# Patient Record
Sex: Female | Born: 1980 | Race: White | Hispanic: No | Marital: Married | Smoking: Former smoker
Health system: Southern US, Community
[De-identification: ages and names within clinical notes are randomized; demographics above are authoritative.]

## PROBLEM LIST (undated history)

## (undated) ENCOUNTER — Inpatient Hospital Stay (HOSPITAL_COMMUNITY): Payer: Self-pay

## (undated) DIAGNOSIS — E559 Vitamin D deficiency, unspecified: Secondary | ICD-10-CM

## (undated) DIAGNOSIS — F329 Major depressive disorder, single episode, unspecified: Secondary | ICD-10-CM

## (undated) DIAGNOSIS — Z8619 Personal history of other infectious and parasitic diseases: Secondary | ICD-10-CM

## (undated) DIAGNOSIS — F32A Depression, unspecified: Secondary | ICD-10-CM

## (undated) DIAGNOSIS — O139 Gestational [pregnancy-induced] hypertension without significant proteinuria, unspecified trimester: Secondary | ICD-10-CM

## (undated) DIAGNOSIS — O210 Mild hyperemesis gravidarum: Secondary | ICD-10-CM

## (undated) DIAGNOSIS — I10 Essential (primary) hypertension: Secondary | ICD-10-CM

## (undated) DIAGNOSIS — IMO0002 Reserved for concepts with insufficient information to code with codable children: Secondary | ICD-10-CM

## (undated) DIAGNOSIS — F419 Anxiety disorder, unspecified: Secondary | ICD-10-CM

## (undated) HISTORY — DX: Essential (primary) hypertension: I10

## (undated) HISTORY — DX: Vitamin D deficiency, unspecified: E55.9

## (undated) HISTORY — PX: TONSILLECTOMY: SUR1361

## (undated) HISTORY — DX: Personal history of other infectious and parasitic diseases: Z86.19

## (undated) HISTORY — DX: Mild hyperemesis gravidarum: O21.0

## (undated) HISTORY — DX: Reserved for concepts with insufficient information to code with codable children: IMO0002

---
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
## 2001-12-26 ENCOUNTER — Other Ambulatory Visit: Admission: RE | Admit: 2001-12-26 | Discharge: 2001-12-26 | Payer: Self-pay | Admitting: Obstetrics and Gynecology

## 2011-06-02 DIAGNOSIS — IMO0002 Reserved for concepts with insufficient information to code with codable children: Secondary | ICD-10-CM

## 2011-06-02 DIAGNOSIS — R87619 Unspecified abnormal cytological findings in specimens from cervix uteri: Secondary | ICD-10-CM

## 2011-06-02 HISTORY — DX: Unspecified abnormal cytological findings in specimens from cervix uteri: R87.619

## 2011-06-02 HISTORY — DX: Reserved for concepts with insufficient information to code with codable children: IMO0002

## 2011-08-02 DIAGNOSIS — O210 Mild hyperemesis gravidarum: Secondary | ICD-10-CM

## 2011-08-02 HISTORY — DX: Mild hyperemesis gravidarum: O21.0

## 2011-09-05 LAB — OB RESULTS CONSOLE GC/CHLAMYDIA: Gonorrhea: NEGATIVE

## 2011-09-05 LAB — OB RESULTS CONSOLE ABO/RH

## 2011-09-05 LAB — OB RESULTS CONSOLE ANTIBODY SCREEN: Antibody Screen: NEGATIVE

## 2011-09-05 LAB — OB RESULTS CONSOLE RUBELLA ANTIBODY, IGM: Rubella: IMMUNE

## 2011-10-21 ENCOUNTER — Encounter (INDEPENDENT_AMBULATORY_CARE_PROVIDER_SITE_OTHER): Payer: BC Managed Care – PPO

## 2011-10-21 DIAGNOSIS — Z331 Pregnant state, incidental: Secondary | ICD-10-CM

## 2011-11-04 DIAGNOSIS — IMO0002 Reserved for concepts with insufficient information to code with codable children: Secondary | ICD-10-CM

## 2011-11-17 ENCOUNTER — Other Ambulatory Visit: Payer: Self-pay | Admitting: Obstetrics and Gynecology

## 2011-11-17 ENCOUNTER — Ambulatory Visit (INDEPENDENT_AMBULATORY_CARE_PROVIDER_SITE_OTHER): Payer: BC Managed Care – PPO

## 2011-11-17 ENCOUNTER — Ambulatory Visit (INDEPENDENT_AMBULATORY_CARE_PROVIDER_SITE_OTHER): Payer: BC Managed Care – PPO | Admitting: Obstetrics and Gynecology

## 2011-11-17 ENCOUNTER — Encounter: Payer: Self-pay | Admitting: Obstetrics and Gynecology

## 2011-11-17 ENCOUNTER — Other Ambulatory Visit: Payer: BC Managed Care – PPO

## 2011-11-17 VITALS — BP 110/70 | Ht 62.0 in | Wt 163.0 lb

## 2011-11-17 DIAGNOSIS — Z3689 Encounter for other specified antenatal screening: Secondary | ICD-10-CM

## 2011-11-17 DIAGNOSIS — Z3400 Encounter for supervision of normal first pregnancy, unspecified trimester: Secondary | ICD-10-CM

## 2011-11-17 LAB — US OB COMP + 14 WK

## 2011-11-17 NOTE — Progress Notes (Signed)
Ultrasound shows:  SIUP  S=D     [place] EDD: 04/10/12           AFI: nl           Cervical length: 4.10 cm           Placenta localization: anterior           Fetal presentation: breech                    Anatomy survey is normal           Gender : DNWTK  Only 1 lb net wt gain.  Supplements suggested Needs rpt pap at NV

## 2011-11-17 NOTE — Progress Notes (Signed)
Pt states she has no concerns ?

## 2011-12-12 ENCOUNTER — Inpatient Hospital Stay (HOSPITAL_COMMUNITY): Admission: AD | Admit: 2011-12-12 | Payer: Self-pay | Source: Ambulatory Visit | Admitting: Obstetrics and Gynecology

## 2011-12-12 ENCOUNTER — Inpatient Hospital Stay (HOSPITAL_COMMUNITY)
Admission: AD | Admit: 2011-12-12 | Payer: BC Managed Care – PPO | Source: Ambulatory Visit | Admitting: Obstetrics and Gynecology

## 2011-12-15 ENCOUNTER — Encounter: Payer: BC Managed Care – PPO | Admitting: Obstetrics and Gynecology

## 2011-12-21 ENCOUNTER — Ambulatory Visit (INDEPENDENT_AMBULATORY_CARE_PROVIDER_SITE_OTHER): Payer: BC Managed Care – PPO | Admitting: Obstetrics and Gynecology

## 2011-12-21 ENCOUNTER — Encounter: Payer: Self-pay | Admitting: Obstetrics and Gynecology

## 2011-12-21 VITALS — BP 110/70 | Wt 168.0 lb

## 2011-12-21 DIAGNOSIS — R87610 Atypical squamous cells of undetermined significance on cytologic smear of cervix (ASC-US): Secondary | ICD-10-CM

## 2011-12-21 DIAGNOSIS — R6889 Other general symptoms and signs: Secondary | ICD-10-CM

## 2011-12-21 DIAGNOSIS — Z331 Pregnant state, incidental: Secondary | ICD-10-CM

## 2011-12-21 DIAGNOSIS — IMO0002 Reserved for concepts with insufficient information to code with codable children: Secondary | ICD-10-CM

## 2011-12-21 NOTE — Progress Notes (Signed)
Patient ID: Susan Zamora, female   DOB: [birth year], 31 y.o.   MRN: [id number] Pap today. Reviewed s/s preterm labor, srom, vag bleeding, kick counts to report, enc 8 water daily and frequent voids, f/o 4 gtt [facility]

## 2011-12-21 NOTE — Progress Notes (Signed)
No concerns per pt Pap due today

## 2011-12-27 LAB — PAP IG W/ RFLX HPV ASCU

## 2012-01-02 ENCOUNTER — Telehealth: Payer: Self-pay | Admitting: Obstetrics and Gynecology

## 2012-01-02 NOTE — Telephone Encounter (Signed)
Triage/cht received 

## 2012-01-03 ENCOUNTER — Encounter: Payer: Self-pay | Admitting: Obstetrics and Gynecology

## 2012-01-05 NOTE — Telephone Encounter (Signed)
TC TO PT REGARDING MSG, LM ON VM TO CALL BACK. 

## 2012-01-13 ENCOUNTER — Telehealth: Payer: Self-pay | Admitting: Obstetrics and Gynecology

## 2012-01-13 NOTE — Telephone Encounter (Signed)
Triage/epic 

## 2012-01-17 ENCOUNTER — Encounter: Payer: Self-pay | Admitting: Obstetrics and Gynecology

## 2012-01-17 NOTE — Telephone Encounter (Signed)
TC TO PT REGARDING MSG, PER SL CAN HAVE LETTER TO ATTEND PRENATAL [place] CLASSES.  LETTER WILL BE AVAILABLE FOR PT TO PICK @ APPT TOMORROW.

## 2012-01-18 ENCOUNTER — Other Ambulatory Visit: Payer: BC Managed Care – PPO

## 2012-01-18 ENCOUNTER — Ambulatory Visit (INDEPENDENT_AMBULATORY_CARE_PROVIDER_SITE_OTHER): Payer: BC Managed Care – PPO

## 2012-01-18 ENCOUNTER — Ambulatory Visit (INDEPENDENT_AMBULATORY_CARE_PROVIDER_SITE_OTHER): Payer: BC Managed Care – PPO | Admitting: Obstetrics and Gynecology

## 2012-01-18 VITALS — BP 110/78 | Wt 168.0 lb

## 2012-01-18 DIAGNOSIS — O2610 Low weight gain in pregnancy, unspecified trimester: Secondary | ICD-10-CM

## 2012-01-18 DIAGNOSIS — O26899 Other specified pregnancy related conditions, unspecified trimester: Secondary | ICD-10-CM

## 2012-01-18 DIAGNOSIS — Z331 Pregnant state, incidental: Secondary | ICD-10-CM

## 2012-01-18 NOTE — Progress Notes (Signed)
1 GTT given today. Pt has no concerns today.

## 2012-01-18 NOTE — Progress Notes (Signed)
Patient ID: Susan Zamora, female   DOB: [birth year], 31 y.o.   MRN: [id number] 1 gtt today, [place] S<D VTX, anterior placenta, Normal AFI growth 75%

## 2012-01-19 LAB — GLUCOSE TOLERANCE, 1 HOUR: Glucose, 1 Hour GTT: 142 mg/dL — ABNORMAL HIGH (ref 70–140)

## 2012-01-19 LAB — US OB FOLLOW UP

## 2012-01-20 ENCOUNTER — Other Ambulatory Visit: Payer: BC Managed Care – PPO

## 2012-01-23 ENCOUNTER — Telehealth: Payer: Self-pay | Admitting: Obstetrics and Gynecology

## 2012-01-23 ENCOUNTER — Other Ambulatory Visit: Payer: Self-pay | Admitting: Obstetrics and Gynecology

## 2012-01-23 DIAGNOSIS — O99810 Abnormal glucose complicating pregnancy: Secondary | ICD-10-CM

## 2012-01-23 NOTE — Telephone Encounter (Signed)
Notified pt of elevated blood glucose on 1 hr GTT.  Pt out of town until 02-05-12.  Mailed diet and appt info. To pt.  Sch appt for 02-10-12@8 :00.

## 2012-02-06 ENCOUNTER — Encounter: Payer: Self-pay | Admitting: Obstetrics and Gynecology

## 2012-02-06 ENCOUNTER — Ambulatory Visit (INDEPENDENT_AMBULATORY_CARE_PROVIDER_SITE_OTHER): Payer: BC Managed Care – PPO | Admitting: Obstetrics and Gynecology

## 2012-02-06 VITALS — BP 110/68 | Wt 173.0 lb

## 2012-02-06 DIAGNOSIS — Z3480 Encounter for supervision of other normal pregnancy, unspecified trimester: Secondary | ICD-10-CM

## 2012-02-06 NOTE — Patient Instructions (Signed)
Fetal Movement Counts Patient Name: __________________________________________________ Patient Due Date: ____________________ Kick counts is highly recommended in high risk pregnancies, but it is a good idea for every pregnant woman to do. Start counting fetal movements at 28 weeks of the pregnancy. Fetal movements increase after eating a full meal or eating or drinking something sweet (the blood sugar is higher). It is also important to drink plenty of fluids (well hydrated) before doing the count. Lie on your left side because it helps with the circulation or you can sit in a comfortable chair with your arms over your belly (abdomen) with no distractions around you. DOING THE COUNT  Try to do the count the same time of day each time you do it.   Mark the day and time, then see how long it takes for you to feel 10 movements (kicks, flutters, swishes, rolls). You should have at least 10 movements within 2 hours. You will most likely feel 10 movements in much less than 2 hours. If you do not, wait an hour and count again. After a couple of days you will see a pattern.   What you are looking for is a change in the pattern or not enough counts in 2 hours. Is it taking longer in time to reach 10 movements?  SEEK MEDICAL CARE IF:  You feel less than 10 counts in 2 hours. Tried twice.   No movement in one hour.   The pattern is changing or taking longer each day to reach 10 counts in 2 hours.   You feel the baby is not moving as it usually does.  Date: ____________ Movements: ____________ Start time: ____________ Finish time: ____________  Date: ____________ Movements: ____________ Start time: ____________ Finish time: ____________ Date: ____________ Movements: ____________ Start time: ____________ Finish time: ____________ Date: ____________ Movements: ____________ Start time: ____________ Finish time: ____________ Date: ____________ Movements: ____________ Start time: ____________ Finish time:  ____________ Date: ____________ Movements: ____________ Start time: ____________ Finish time: ____________ Date: ____________ Movements: ____________ Start time: ____________ Finish time: ____________ Date: ____________ Movements: ____________ Start time: ____________ Finish time: ____________  Date: ____________ Movements: ____________ Start time: ____________ Finish time: ____________ Date: ____________ Movements: ____________ Start time: ____________ Finish time: ____________ Date: ____________ Movements: ____________ Start time: ____________ Finish time: ____________ Date: ____________ Movements: ____________ Start time: ____________ Finish time: ____________ Date: ____________ Movements: ____________ Start time: ____________ Finish time: ____________ Date: ____________ Movements: ____________ Start time: ____________ Finish time: ____________ Date: ____________ Movements: ____________ Start time: ____________ Finish time: ____________  Date: ____________ Movements: ____________ Start time: ____________ Finish time: ____________ Date: ____________ Movements: ____________ Start time: ____________ Finish time: ____________ Date: ____________ Movements: ____________ Start time: ____________ Finish time: ____________ Date: ____________ Movements: ____________ Start time: ____________ Finish time: ____________ Date: ____________ Movements: ____________ Start time: ____________ Finish time: ____________ Date: ____________ Movements: ____________ Start time: ____________ Finish time: ____________ Date: ____________ Movements: ____________ Start time: ____________ Finish time: ____________  Date: ____________ Movements: ____________ Start time: ____________ Finish time: ____________ Date: ____________ Movements: ____________ Start time: ____________ Finish time: ____________ Date: ____________ Movements: ____________ Start time: ____________ Finish time: ____________ Date: ____________ Movements:  ____________ Start time: ____________ Finish time: ____________ Date: ____________ Movements: ____________ Start time: ____________ Finish time: ____________ Date: ____________ Movements: ____________ Start time: ____________ Finish time: ____________ Date: ____________ Movements: ____________ Start time: ____________ Finish time: ____________  Date: ____________ Movements: ____________ Start time: ____________ Finish time: ____________ Date: ____________ Movements: ____________ Start time: ____________ Finish time: ____________ Date: ____________ Movements: ____________ Start time:   ____________ Finish time: ____________ Date: ____________ Movements: ____________ Start time: ____________ Finish time: ____________ Date: ____________ Movements: ____________ Start time: ____________ Finish time: ____________ Date: ____________ Movements: ____________ Start time: ____________ Finish time: ____________ Date: ____________ Movements: ____________ Start time: ____________ Finish time: ____________  Date: ____________ Movements: ____________ Start time: ____________ Finish time: ____________ Date: ____________ Movements: ____________ Start time: ____________ Finish time: ____________ Date: ____________ Movements: ____________ Start time: ____________ Finish time: ____________ Date: ____________ Movements: ____________ Start time: ____________ Finish time: ____________ Date: ____________ Movements: ____________ Start time: ____________ Finish time: ____________ Date: ____________ Movements: ____________ Start time: ____________ Finish time: ____________ Date: ____________ Movements: ____________ Start time: ____________ Finish time: ____________  Date: ____________ Movements: ____________ Start time: ____________ Finish time: ____________ Date: ____________ Movements: ____________ Start time: ____________ Finish time: ____________ Date: ____________ Movements: ____________ Start time: ____________ Finish  time: ____________ Date: ____________ Movements: ____________ Start time: ____________ Finish time: ____________ Date: ____________ Movements: ____________ Start time: ____________ Finish time: ____________ Date: ____________ Movements: ____________ Start time: ____________ Finish time: ____________ Date: ____________ Movements: ____________ Start time: ____________ Finish time: ____________  Date: ____________ Movements: ____________ Start time: ____________ Finish time: ____________ Date: ____________ Movements: ____________ Start time: ____________ Finish time: ____________ Date: ____________ Movements: ____________ Start time: ____________ Finish time: ____________ Date: ____________ Movements: ____________ Start time: ____________ Finish time: ____________ Date: ____________ Movements: ____________ Start time: ____________ Finish time: ____________ Date: ____________ Movements: ____________ Start time: ____________ Finish time: ____________ Document Released: 08/17/2006 Document Revised: 07/07/2011 Document Reviewed: 02/17/2009 [employer] Patient Information 2012 [employer]. 

## 2012-02-06 NOTE — Progress Notes (Signed)
Pt states no concerns today.  One hr glucola 142 FKC reviewed Pt scheduled for 3 hr gtt.  02/10/12 RT 2 weeks

## 2012-02-07 ENCOUNTER — Telehealth: Payer: Self-pay | Admitting: Obstetrics and Gynecology

## 2012-02-10 ENCOUNTER — Other Ambulatory Visit: Payer: Self-pay | Admitting: Obstetrics and Gynecology

## 2012-02-10 ENCOUNTER — Telehealth: Payer: Self-pay | Admitting: Obstetrics and Gynecology

## 2012-02-11 LAB — GLUCOSE TOLERANCE, 3 HOURS
Glucose Tolerance, 1 hour: 145 mg/dL (ref 70–189)
Glucose Tolerance, 2 hour: 130 mg/dL (ref 70–164)
Glucose Tolerance, Fasting: 90 mg/dL (ref 70–104)

## 2012-02-20 ENCOUNTER — Ambulatory Visit (INDEPENDENT_AMBULATORY_CARE_PROVIDER_SITE_OTHER): Payer: BC Managed Care – PPO | Admitting: Obstetrics and Gynecology

## 2012-02-20 ENCOUNTER — Encounter: Payer: Self-pay | Admitting: Obstetrics and Gynecology

## 2012-02-20 VITALS — BP 100/66 | Wt 170.0 lb

## 2012-02-20 DIAGNOSIS — Z3490 Encounter for supervision of normal pregnancy, unspecified, unspecified trimester: Secondary | ICD-10-CM

## 2012-02-20 DIAGNOSIS — Z331 Pregnant state, incidental: Secondary | ICD-10-CM

## 2012-02-20 NOTE — Progress Notes (Signed)
[redacted]w[redacted]d C/o Nausea - mostly acid reflux -  Recommended Zantac 150 mgs po once daily No change in vaginal secretions. No further complaints

## 2012-02-20 NOTE — Progress Notes (Signed)
Pt c/o N/V.

## 2012-03-06 ENCOUNTER — Ambulatory Visit (INDEPENDENT_AMBULATORY_CARE_PROVIDER_SITE_OTHER): Payer: BC Managed Care – PPO

## 2012-03-06 VITALS — BP 108/86 | Wt 171.0 lb

## 2012-03-06 DIAGNOSIS — O2610 Low weight gain in pregnancy, unspecified trimester: Secondary | ICD-10-CM

## 2012-03-06 DIAGNOSIS — O26899 Other specified pregnancy related conditions, unspecified trimester: Secondary | ICD-10-CM

## 2012-03-06 NOTE — Progress Notes (Signed)
No complaints today.

## 2012-03-06 NOTE — Progress Notes (Signed)
[redacted]w[redacted]d. Well.  Will do growth u/s NV for poor wt gain, along w/ GBS.  Still in [place] classes.  GFM.  No PTL s/s.  S.o., Iantha Fallen, at visit.

## 2012-03-20 ENCOUNTER — Ambulatory Visit (INDEPENDENT_AMBULATORY_CARE_PROVIDER_SITE_OTHER): Payer: BC Managed Care – PPO

## 2012-03-20 VITALS — Wt 173.0 lb

## 2012-03-20 DIAGNOSIS — O2610 Low weight gain in pregnancy, unspecified trimester: Secondary | ICD-10-CM

## 2012-03-20 DIAGNOSIS — Z331 Pregnant state, incidental: Secondary | ICD-10-CM

## 2012-03-20 DIAGNOSIS — O26899 Other specified pregnancy related conditions, unspecified trimester: Secondary | ICD-10-CM

## 2012-03-20 LAB — US OB FOLLOW UP

## 2012-03-20 NOTE — Progress Notes (Signed)
[redacted]w[redacted]d GBS today; int os FT; ext os loose 1cm/ very soft; 50%.  Growth u/s today for poor wt gain & EFW=7+6 (87%); AFI=19 (70%); Ant placenta; of NOTE: ECHOGENIC DEBRIS W/IN FETAL GB--COULD REPRESENT STONES, SLUDGE, OR CHOLESTEROL CRYSTALS; will put on IPPT tool. Disc'd BP--pt to condense.  May use tub to labor (doula to bring and set up), but doesn't plan to labor in.   Rev'd FKC and labor s/s.

## 2012-03-20 NOTE — Progress Notes (Signed)
No complaints.  GBS today U/S Comments: Vertex Presentation, Anterior Placenta. Normal Fluid: 70%tile. Growth remaining consistent. EFW = 89%tile  Note: Echogenic debris within the fetal gallbladder is visualized. This could represent: stones, sludge, or cholesterol crystals.  Cx not measured. Normal adnexa.

## 2012-03-30 ENCOUNTER — Encounter: Payer: Self-pay | Admitting: Obstetrics and Gynecology

## 2012-03-30 ENCOUNTER — Ambulatory Visit (INDEPENDENT_AMBULATORY_CARE_PROVIDER_SITE_OTHER): Payer: BC Managed Care – PPO | Admitting: Obstetrics and Gynecology

## 2012-03-30 VITALS — BP 120/82 | Wt 174.0 lb

## 2012-03-30 DIAGNOSIS — Z331 Pregnant state, incidental: Secondary | ICD-10-CM

## 2012-03-30 DIAGNOSIS — Z3490 Encounter for supervision of normal pregnancy, unspecified, unspecified trimester: Secondary | ICD-10-CM

## 2012-03-30 NOTE — Progress Notes (Signed)
No complaints per pt

## 2012-03-30 NOTE — Progress Notes (Signed)
[redacted]w[redacted]d  no complaints No change in vaginal secretions FM+ Does not desire IOL if possible Will attend accupucture therapy this week with view to assisting delivery process. ROB x 1 week.

## 2012-04-05 ENCOUNTER — Ambulatory Visit (INDEPENDENT_AMBULATORY_CARE_PROVIDER_SITE_OTHER): Payer: Self-pay | Admitting: Obstetrics and Gynecology

## 2012-04-05 ENCOUNTER — Encounter: Payer: Self-pay | Admitting: Obstetrics and Gynecology

## 2012-04-05 VITALS — BP 108/82 | Wt 171.0 lb

## 2012-04-05 DIAGNOSIS — Z331 Pregnant state, incidental: Secondary | ICD-10-CM

## 2012-04-05 NOTE — Patient Instructions (Signed)

## 2012-04-05 NOTE — Progress Notes (Signed)
A/P GBS negative Fetal kick counts reviewed Labor reviewed with pt All patients  questions answered

## 2012-04-05 NOTE — Progress Notes (Signed)
Pt states no concerns today and does not want cervix checked. Pt has chosen Dr. Jolaine Click at [facility] for pediatrician.

## 2012-04-11 ENCOUNTER — Ambulatory Visit (INDEPENDENT_AMBULATORY_CARE_PROVIDER_SITE_OTHER): Payer: Self-pay | Admitting: Obstetrics and Gynecology

## 2012-04-11 ENCOUNTER — Encounter: Payer: Self-pay | Admitting: Obstetrics and Gynecology

## 2012-04-11 VITALS — BP 122/72 | Wt 173.0 lb

## 2012-04-11 DIAGNOSIS — Z331 Pregnant state, incidental: Secondary | ICD-10-CM

## 2012-04-11 DIAGNOSIS — Z3490 Encounter for supervision of normal pregnancy, unspecified, unspecified trimester: Secondary | ICD-10-CM

## 2012-04-11 NOTE — Progress Notes (Signed)
[redacted]w[redacted]d pt unable to void Request cervix check.

## 2012-04-11 NOTE — Progress Notes (Signed)
Doing well--desires to avoid induction as long as possible, understands recommendation for delivery prior to 42 weeks. Cervix FT, 50%, vtx, -2, soft. Birth plan had been reviewed with HS, copy today to scan center. NST NV.

## 2012-04-19 ENCOUNTER — Encounter: Payer: Self-pay | Admitting: Obstetrics and Gynecology

## 2012-04-19 ENCOUNTER — Ambulatory Visit (INDEPENDENT_AMBULATORY_CARE_PROVIDER_SITE_OTHER): Payer: BC Managed Care – PPO | Admitting: Obstetrics and Gynecology

## 2012-04-19 ENCOUNTER — Other Ambulatory Visit: Payer: BC Managed Care – PPO

## 2012-04-19 VITALS — BP 130/88 | Wt 175.0 lb

## 2012-04-19 DIAGNOSIS — O480 Post-term pregnancy: Secondary | ICD-10-CM

## 2012-04-19 NOTE — Progress Notes (Signed)
Pt here for ROB after NST for post-dates. C/o increased pressure/ no pain. B/P elevated today 144/90. On second check it is 130/88. No visual changes, H/A's. She does state she has epigastric pain/indigestion, but she has had problems with that the entire pregnancy.

## 2012-04-19 NOTE — Progress Notes (Signed)
[redacted]w[redacted]d NST reactive Pt and husband refuse to be scheduled for IOL. Informed that standard of care is no more than 42 weeks for risks of placental insufficiency. Still decline. Will follow-up in 1 week with BPP and NST Pt informed that we may have her sign AMA

## 2012-04-25 ENCOUNTER — Encounter (HOSPITAL_COMMUNITY): Payer: Self-pay | Admitting: Anesthesiology

## 2012-04-25 ENCOUNTER — Encounter (HOSPITAL_COMMUNITY): Payer: Self-pay

## 2012-04-25 ENCOUNTER — Inpatient Hospital Stay (HOSPITAL_COMMUNITY)
Admission: AD | Admit: 2012-04-25 | Discharge: 2012-04-29 | DRG: 370 | Disposition: A | Discharge status: Home / Self Care | Payer: BC Managed Care – PPO | Source: Ambulatory Visit | Attending: Obstetrics and Gynecology | Admitting: Obstetrics and Gynecology

## 2012-04-25 DIAGNOSIS — O34599 Maternal care for other abnormalities of gravid uterus, unspecified trimester: Secondary | ICD-10-CM | POA: Diagnosis present

## 2012-04-25 DIAGNOSIS — O324XX0 Maternal care for high head at term, not applicable or unspecified: Secondary | ICD-10-CM | POA: Diagnosis present

## 2012-04-25 DIAGNOSIS — O2610 Low weight gain in pregnancy, unspecified trimester: Secondary | ICD-10-CM

## 2012-04-25 DIAGNOSIS — D4959 Neoplasm of unspecified behavior of other genitourinary organ: Secondary | ICD-10-CM | POA: Diagnosis present

## 2012-04-25 DIAGNOSIS — IMO0002 Reserved for concepts with insufficient information to code with codable children: Secondary | ICD-10-CM | POA: Diagnosis not present

## 2012-04-25 DIAGNOSIS — D259 Leiomyoma of uterus, unspecified: Secondary | ICD-10-CM | POA: Diagnosis present

## 2012-04-25 LAB — COMPREHENSIVE METABOLIC PANEL
ALT: 11 U/L (ref 0–35)
AST: 17 U/L (ref 0–37)
Albumin: 2.6 g/dL — ABNORMAL LOW (ref 3.5–5.2)
BUN: 11 mg/dL (ref 6–23)
CO2: 20 mEq/L (ref 19–32)
Calcium: 9.4 mg/dL (ref 8.4–10.5)
Chloride: 94 mEq/L — ABNORMAL LOW (ref 96–112)
Creatinine, Ser: 0.81 mg/dL (ref 0.50–1.10)
GFR calc Af Amer: >90 mL/min (ref >90)
GFR calc non Af Amer: >90 mL/min (ref >90)
Glucose, Bld: 114 mg/dL — ABNORMAL HIGH (ref 70–99)
Glucose, Bld: 135 mg/dL — ABNORMAL HIGH (ref 70–99)
Total Bilirubin: 1 mg/dL (ref 0.3–1.2)
Total Protein: 6.4 g/dL (ref 6.0–8.3)

## 2012-04-25 LAB — URINALYSIS, ROUTINE W REFLEX MICROSCOPIC
Leukocytes, UA: NEGATIVE
Nitrite: NEGATIVE
Protein, ur: NEGATIVE mg/dL
Specific Gravity, Urine: >1.03 — ABNORMAL HIGH (ref 1.005–1.030)
Urobilinogen, UA: 0.2 mg/dL (ref 0.0–1.0)

## 2012-04-25 LAB — CBC
HCT: 35 % — ABNORMAL LOW (ref 36.0–46.0)
Hemoglobin: 11.7 g/dL — ABNORMAL LOW (ref 12.0–15.0)
Hemoglobin: 12.1 g/dL (ref 12.0–15.0)
MCHC: 33.2 g/dL (ref 30.0–36.0)
MCHC: 34.6 g/dL (ref 30.0–36.0)
MCV: 84.3 fL (ref 78.0–100.0)
WBC: 14.7 10*3/uL — ABNORMAL HIGH (ref 4.0–10.5)

## 2012-04-25 LAB — URIC ACID
Uric Acid, Serum: 5.9 mg/dL (ref 2.4–7.0)
Uric Acid, Serum: 6.2 mg/dL (ref 2.4–7.0)

## 2012-04-25 LAB — URINE MICROSCOPIC-ADD ON

## 2012-04-25 LAB — LACTATE DEHYDROGENASE: LDH: 170 U/L (ref 94–250)

## 2012-04-25 LAB — RPR: RPR Ser Ql: NONREACTIVE

## 2012-04-25 MED ORDER — FENTANYL 2.5 MCG/ML BUPIVACAINE 1/10 % EPIDURAL INFUSION (WH - ANES)
INTRAMUSCULAR | Status: DC | PRN
  Administered 2012-04-25: 14 mL/h via EPIDURAL

## 2012-04-25 MED ORDER — EPHEDRINE 5 MG/ML INJ: 10.0000 mg | INTRAVENOUS | Status: DC | PRN

## 2012-04-25 MED ORDER — EPHEDRINE 5 MG/ML INJ
10.0000 mg | INTRAVENOUS | Status: DC | PRN
  Filled 2012-04-25: qty 4

## 2012-04-25 MED ORDER — OXYTOCIN 40 UNITS IN LACTATED RINGERS INFUSION - SIMPLE MED
62.5000 mL/h | Freq: Once | INTRAVENOUS | Status: DC
  Filled 2012-04-25: qty 1000

## 2012-04-25 MED ORDER — IBUPROFEN 600 MG PO TABS: 600.0000 mg | ORAL_TABLET | Freq: Four times a day (QID) | ORAL | Status: DC | PRN

## 2012-04-25 MED ORDER — OXYCODONE-ACETAMINOPHEN 5-325 MG PO TABS: 1.0000 | ORAL_TABLET | ORAL | Status: DC | PRN

## 2012-04-25 MED ORDER — PHENYLEPHRINE 40 MCG/ML (10ML) SYRINGE FOR IV PUSH (FOR BLOOD PRESSURE SUPPORT)
80.0000 ug | PREFILLED_SYRINGE | INTRAVENOUS | Status: DC | PRN
  Filled 2012-04-25: qty 5

## 2012-04-25 MED ORDER — LIDOCAINE HCL (PF) 1 % IJ SOLN
30.0000 mL | INTRAMUSCULAR | Status: DC | PRN
  Filled 2012-04-25: qty 30

## 2012-04-25 MED ORDER — LACTATED RINGERS IV SOLN: 500.0000 mL | INTRAVENOUS | Status: DC | PRN

## 2012-04-25 MED ORDER — ONDANSETRON HCL 4 MG/2ML IJ SOLN
4.0000 mg | Freq: Four times a day (QID) | INTRAMUSCULAR | Status: DC | PRN
  Administered 2012-04-26: 4 mg via INTRAVENOUS
  Filled 2012-04-25: qty 2

## 2012-04-25 MED ORDER — CITRIC ACID-SODIUM CITRATE 334-500 MG/5ML PO SOLN
30.0000 mL | ORAL | Status: DC | PRN
  Administered 2012-04-25 – 2012-04-26 (×3): 30 mL via ORAL
  Filled 2012-04-25 (×3): qty 15

## 2012-04-25 MED ORDER — OXYTOCIN 10 UNIT/ML IJ SOLN: 10.0000 [IU] | Freq: Once | INTRAMUSCULAR | Status: DC

## 2012-04-25 MED ORDER — ACETAMINOPHEN 325 MG PO TABS: 650.0000 mg | ORAL_TABLET | ORAL | Status: DC | PRN

## 2012-04-25 MED ORDER — DIPHENHYDRAMINE HCL 50 MG/ML IJ SOLN: 12.5000 mg | INTRAMUSCULAR | Status: DC | PRN

## 2012-04-25 MED ORDER — FENTANYL 2.5 MCG/ML BUPIVACAINE 1/10 % EPIDURAL INFUSION (WH - ANES)
14.0000 mL/h | INTRAMUSCULAR | Status: DC
  Administered 2012-04-26 (×5): 14 mL/h via EPIDURAL
  Filled 2012-04-25 (×6): qty 60

## 2012-04-25 MED ORDER — LACTATED RINGERS IV SOLN
INTRAVENOUS | Status: DC
  Administered 2012-04-25 – 2012-04-26 (×4): via INTRAVENOUS

## 2012-04-25 MED ORDER — DEXTROSE 5 % IN LACTATED RINGERS IV BOLUS
300.0000 mL | Freq: Once | INTRAVENOUS | Status: AC
  Administered 2012-04-25: 300 mL via INTRAVENOUS

## 2012-04-25 MED ORDER — OXYTOCIN BOLUS FROM INFUSION
500.0000 mL | Freq: Once | INTRAVENOUS | Status: DC
  Filled 2012-04-25: qty 500

## 2012-04-25 MED ORDER — LACTATED RINGERS IV SOLN
500.0000 mL | Freq: Once | INTRAVENOUS | Status: AC
  Administered 2012-04-25: 1000 mL via INTRAVENOUS

## 2012-04-25 MED ORDER — LIDOCAINE HCL (PF) 1 % IJ SOLN
INTRAMUSCULAR | Status: DC | PRN
  Administered 2012-04-25: 9 mL
  Administered 2012-04-25: 7 mL

## 2012-04-25 MED ORDER — LACTATED RINGERS IV BOLUS (SEPSIS)
500.0000 mL | Freq: Once | INTRAVENOUS | Status: AC
  Administered 2012-04-25: 500 mL via INTRAVENOUS

## 2012-04-25 MED ORDER — PHENYLEPHRINE 40 MCG/ML (10ML) SYRINGE FOR IV PUSH (FOR BLOOD PRESSURE SUPPORT): 80.0000 ug | PREFILLED_SYRINGE | INTRAVENOUS | Status: DC | PRN

## 2012-04-25 NOTE — Progress Notes (Signed)
Patient ID: Susan Zamora, female   DOB: March 05, 1981, 31 y.o.   MRN: [id number] .Subjective: In birth tub now, breathing and moaning w ctx, doula and husband at bs   Objective: BP 140/83  Pulse 81  Temp 97.5 F (36.4 C) (Oral)  Resp 18  LMP 07/07/2011   FHT:  120 intermittent  UC:   regular, every 2 minutes SVE:   Deferred    Assessment / Plan: Spontaneous labor, progressing normally GBS neg   Fetal Wellbeing:  Category not determined secondary to intermittent EFM Pain Control:  Labor support without medications  Update physician PRN  [name] 04/25/2012, 11:16 AM

## 2012-04-25 NOTE — Progress Notes (Signed)
Patient ID: Parrish Daddario, female   DOB: 1980/11/25, 31 y.o.   MRN: [id number]. Marland KitchenSubjective: Pt out of tub, resting on L side in bed, c/o "I dont feel right" can't describe, denies any HA/blurry vision/RUQ pain, denies CP or SOB   c/o back pain w ctx, less urge to push since getting out of tub   Objective: BP 146/102  Pulse 126  Temp 97.8 F (36.6 C) (Axillary)  Resp 16  Ht 5\' 2"  (1.575 m)  Wt 175 lb (79.379 kg)  BMI 32.01 kg/m2  LMP 07/07/2011 Filed Vitals:   04/25/12 1903 04/25/12 1936 04/25/12 1943 04/25/12 1950  BP: 164/96 157/102  146/102  Pulse: 83 104 85 126  Temp:  97.8 F (36.6 C)    TempSrc:  Axillary    Resp: 16 16  16   Height:      Weight:         FHT:  FHR: 130 bpm, variability: moderate,  accelerations:  Present,  decelerations:  Absent UC:   regular, every 2 minutes SVE:   Dilation: Lip/rim Effacement (%): 100 Station: -1 Exam by:: S.Jule Whitsel,cnm    Assessment / Plan: Spontaneous labor, progressing normally Appears to have SROM, sm amt clear fluid, bloody show BP elevated Tachycardic  Afebrile   Will give IVF bolus,  Pt out of tub, unless BP normalizes  Continue to give support   Fetal Wellbeing:  Category I Pain Control:  Labor support without medications  Dr Su Hilt updated   [name] 04/25/2012, 8:01 PM

## 2012-04-25 NOTE — MAU Note (Signed)
Patient is in for labor eval. She states that the ctx q2-74m. She is nauseated and states that she vomitied at home. She reports good fetal movement. She states that she had thin vaginal discharge.

## 2012-04-25 NOTE — Progress Notes (Signed)
Patient ID: Susan Zamora, female   DOB: 1981/03/20, 31 y.o.   MRN: [id number] .Subjective: Involuntarily pushing w most ctx, coping well, in WB tub    Objective: BP 143/90  Pulse 86  Temp 97.5 F (36.4 C) (Oral)  Resp 18  LMP 07/07/2011   FHT:  FHR: 120 bpm, variability: moderate,  accelerations:  Present,  decelerations:  Present mild early variable UC:   regular, every 2-3 minutes SVE:   Dilation: 4 Effacement (%): 100 Station: -1 Exam by:: Celeste Candelas cnm  Pt declines VE  Assessment / Plan: Spontaneous labor, progressing normally  Pt agreed to IVF's   Fetal Wellbeing:  Category I Pain Control:  Labor support without medications  Update physician PRN  [name] 04/25/2012, 2:32 PM

## 2012-04-25 NOTE — H&P (Signed)
Susan Zamora is a 31 y.o. female presenting for reg ctx since about 230am. Denies LOF, VB, reports +FM. Pt desires WB, doula "Trula Ore" here w pt.    Maternal Medical History:  Reason for admission: Reason for admission: contractions and nausea.  Contractions: Onset was 6-12 hours ago.   Frequency: regular.   Duration is approximately 60 seconds.   Perceived severity is moderate.    Fetal activity: Perceived fetal activity is normal.   Last perceived fetal movement was within the past hour.    Prenatal complications: no prenatal complications   OB History    Grav Para Term Preterm Abortions TAB SAB Ect Mult Living   1         0     Past Medical History  Diagnosis Date  . Abnormal Pap smear 06/2011    ASCUS  . H/O varicella   . Hyperemesis arising during pregnancy 2013   Past Surgical History  Procedure Date  . Wisdom tooth extraction 1996    age 69  . Tonsillectomy 1985    age 23   Family History: family history includes Alcohol abuse in her brother, father, and sister; Kidney disease in her sister; and Mental illness in her mother. Social History:  reports that she has never smoked. She has never used smokeless tobacco. She reports that she does not drink alcohol or use illicit drugs.   Prenatal Transfer Tool  Maternal Diabetes: No elevated 1hr gtt, 3hr WNL  Genetic Screening: Declined Maternal Ultrasounds/Referrals: Normal Fetal Ultrasounds or other Referrals:  None Maternal Substance Abuse:  No Significant Maternal Medications:  None Significant Maternal Lab Results:  None Other Comments:  GBS neg  Review of Systems  Eyes: Negative for blurred vision.  Respiratory: Negative for shortness of breath.   Cardiovascular: Negative for chest pain.  Gastrointestinal: Positive for heartburn, nausea and vomiting. Negative for abdominal pain.  Musculoskeletal: Positive for back pain.  Neurological: Negative for headaches.  All other systems reviewed and are  negative.    Dilation: 4 Effacement (%): 100 Station: -1 Exam by:: Kimba Lottes cnm Blood pressure 143/90, pulse 86, temperature 97.5 F (36.4 C), temperature source Oral, resp. rate 18, last menstrual period 07/07/2011. Maternal Exam:  Uterine Assessment: Contraction strength is moderate.  Contraction duration is 60 seconds. Contraction frequency is regular.   Abdomen: Patient reports no abdominal tenderness. Fundal height is aga.   Estimated fetal weight is 8.   Fetal presentation: vertex  Introitus: Normal vulva. Normal vagina.  Pelvis: adequate for delivery.   Cervix: Cervix evaluated by digital exam.     Fetal Exam Fetal Monitor Review: Mode: ultrasound.   Baseline rate: 140.  Variability: moderate (6-25 bpm).   Pattern: accelerations present and no decelerations.    Fetal State Assessment: Category I - tracings are normal.     Physical Exam  Nursing note and vitals reviewed. Constitutional: She is oriented to person, place, and time. She appears well-developed and well-nourished. She appears distressed.  HENT:  Head: Normocephalic.  Eyes: Pupils are equal, round, and reactive to light.  Neck: Normal range of motion.  Cardiovascular: Normal rate, regular rhythm and normal heart sounds.   Respiratory: Effort normal and breath sounds normal.  GI: Soft. Bowel sounds are normal.  Genitourinary: Vagina normal.  Musculoskeletal: Normal range of motion. She exhibits no edema.  Neurological: She is alert and oriented to person, place, and time. She has normal reflexes.  Skin: Skin is warm and dry.  Psychiatric: She has a  normal mood and affect. Her behavior is normal.    Prenatal labs: ABO, Rh: --/--/O/Positive (06/19 1303) Antibody: Negative (02/04 0000) Rubella: Immune (02/04 0000) RPR: NON REAC (06/19 0941)  HBsAg: Negative (02/04 0000)  HIV: Non-reactive (02/04 0000)  GBS: NEGATIVE (08/20 1113)   Assessment/Plan: IUP at [redacted]w[redacted]d Active labor GBS neg FHR  reassuring BP's elevated Pt desires WB  Admit to b.s per c/w Dr Su Hilt [facility] labs and UA Intermittent EFM May hold IV for now Will watch BP's closely    Susan Zamora M 04/25/2012, 2:29 PM

## 2012-04-25 NOTE — Progress Notes (Signed)
Patient ID: Susan Zamora, female   DOB: 08/13/1980, 31 y.o.   MRN: [id number] .Subjective:  Has been out of tub, rested between ctx some, has more urge to push, voiding w intermittent bearing down   Objective: BP 147/80  Pulse 90  Temp 97.8 F (36.6 C) (Axillary)  Resp 18  Ht 5\' 2"  (1.575 m)  Wt 175 lb (79.379 kg)  BMI 32.01 kg/m2  LMP 07/07/2011   FHT:  FHR: 130 bpm, variability: moderate,  accelerations:  Present,  decelerations:  Absent UC:   regular, every 2 minutes SVE:   Dilation: Lip/rim Effacement (%): 100 Station: -1 Exam by:: S.Gaelan Glennon,cnm  sm BOW felt   Assessment / Plan: Protracted active phase No cervical change or descent, OP BP improved - back to baseline upon admission PIH labs WNL, WBC elevated - no s/s infection After discussion, pt desires epidural  Will proceed and allow for laboring down  Recommend 24 hour urine collection for protein w foley catheter     Fetal Wellbeing:  Category I Pain Control:  Labor support without medications  D/W Dr Imagene Gurney 04/25/2012, 10:29 PM

## 2012-04-25 NOTE — Progress Notes (Signed)
Patient ID: Susan Zamora, female   DOB: 03-17-1981, 31 y.o.   MRN: [id number] .Subjective:  Urge to push  Objective: BP 141/97  Pulse 91  Temp 97.7 F (36.5 C) (Oral)  Resp 18  LMP 07/07/2011   FHT:  FHR: 130 bpm, variability: moderate,  accelerations:  Abscent,  decelerations:  Absent UC:   regular, every 2 minutes SVE:   Dilation: 8 Effacement (%): 100 Station: -1 Exam by:: Sanda Klein, cnm    Assessment / Plan: Spontaneous labor, progressing normally Enc position changes and breathing through ctx    Fetal Wellbeing:  Category I Pain Control:  Labor support without medications  Update physician PRN  [name] 04/25/2012, 4:00 PM

## 2012-04-25 NOTE — Anesthesia Preprocedure Evaluation (Addendum)
Anesthesia Evaluation  Patient identified by MRN, date of birth, ID band Patient awake    Reviewed: Allergy & Precautions, H&P , NPO status , Patient's Chart, lab work & pertinent test results  Airway Mallampati: II TM Distance: >3 FB Neck ROM: full    Dental No notable dental hx.    Pulmonary neg pulmonary ROS,    Pulmonary exam normal       Cardiovascular hypertension (some elevated pressures, r/o PIH, doing 24 hr urine),     Neuro/Psych negative neurological ROS  negative psych ROS   GI/Hepatic negative GI ROS, Neg liver ROS,   Endo/Other  negative endocrine ROS  Renal/GU negative Renal ROS  negative genitourinary   Musculoskeletal negative musculoskeletal ROS (+)   Abdominal Normal abdominal exam  (+)   Peds negative pediatric ROS (+)  Hematology negative hematology ROS (+)   Anesthesia Other Findings   Reproductive/Obstetrics (+) Pregnancy                          Anesthesia Physical Anesthesia Plan  ASA: II and Emergent  Anesthesia Plan: Epidural   Post-op Pain Management:    Induction:   Airway Management Planned:   Additional Equipment:   Intra-op Plan:   Post-operative Plan:   Informed Consent: I have reviewed the patients History and Physical, chart, labs and discussed the procedure including the risks, benefits and alternatives for the proposed anesthesia with the patient or authorized representative who has indicated his/her understanding and acceptance.     Plan Discussed with: CRNA and Surgeon  Anesthesia Plan Comments:        Anesthesia Quick Evaluation

## 2012-04-25 NOTE — Progress Notes (Signed)
Patient ID: Susan Zamora, female   DOB: [birth year], 31 y.o.   MRN: [id number] .Subjective: More urge to push now, breathing through some ctx, in and out of birth tub, has been voiding in tub    Objective: BP 146/55  Pulse 86  Temp 99.1 F (37.3 C) (Oral)  Resp 20  Ht 5\' 2"  (1.575 m)  Wt 175 lb (79.379 kg)  BMI 32.01 kg/m2  LMP 07/07/2011  Filed Vitals:   04/25/12 1702 04/25/12 1704 04/25/12 1731 04/25/12 1808  BP: 143/87   146/55  Pulse: 78   86  Temp:    99.1 F (37.3 C)  TempSrc:    Oral  Resp:   20 20  Height:  5\' 2"  (1.575 m)    Weight:  175 lb (79.379 kg)       FHT:  FHR 125, audible accel noted, rare brief audible decel,  UC:   regular, every 2 minutes SVE:   Declined VE    Assessment / Plan: Spontaneous labor, progressing normally Mild BP elevation, no s/s PIH  rcv'd IVF's   Continue support   Fetal Wellbeing:  Category I Pain Control:  Labor support without medications  Update physician PRN  [name] 04/25/2012, 6:33 PM

## 2012-04-25 NOTE — Progress Notes (Signed)
Susan Zamora is a 31 y.o. G1P0 at [redacted]w[redacted]d by LMP admitted for active labor  Subjective:  Using  Birth tub for pain mgmnt, coping well, moaning w ctx, changing positions frequently  Objective: BP 142/76  Pulse 68  Temp 97.5 F (36.4 C) (Oral)  Resp 18  LMP 07/07/2011     Filed Vitals:   04/25/12 1100 04/25/12 1149 04/25/12 1156 04/25/12 1301  BP: 140/83  142/76   Pulse: 81  68   Temp: 97.5 F (36.4 C)     TempSrc: Oral     Resp: 18 20  18      FHT:  FHR: 120 bpm, variability: moderate,  accelerations:  Present,  decelerations:  Absent intermittent tracing  UC:   regular, every 2-3  minutes SVE:   Dilation: 4 Effacement (%): 100 Station: -1 Exam by:: Sallie Staron cnm  VE deferred   Labs: Lab Results  Component Value Date   WBC 14.7* 04/25/2012   HGB 11.7* 04/25/2012   HCT 35.2* 04/25/2012   MCV 85.6 04/25/2012   PLT 249 04/25/2012   Results for orders placed during the hospital encounter of 04/25/12 (from the past 24 hour(s))  COMPREHENSIVE METABOLIC PANEL     Status: Abnormal   Collection Time   04/25/12  9:25 AM      Component Value Range   Sodium 130 (*) 135 - 145 mEq/L   Potassium 3.8  3.5 - 5.1 mEq/L   Chloride 97  96 - 112 mEq/L   CO2 21  19 - 32 mEq/L   Glucose, Bld 114 (*) 70 - 99 mg/dL   BUN 11  6 - 23 mg/dL   Creatinine, Ser 4.09  0.50 - 1.10 mg/dL   Calcium 9.4  8.4 - 81.1 mg/dL   Total Protein 6.4  6.0 - 8.3 g/dL   Albumin 2.6 (*) 3.5 - 5.2 g/dL   AST 15  0 - 37 U/L   ALT 12  0 - 35 U/L   Alkaline Phosphatase 224 (*) 39 - 117 U/L   Total Bilirubin 0.6  0.3 - 1.2 mg/dL   GFR calc non Af Amer 84 (*) >90 mL/min   GFR calc Af Amer >90  >90 mL/min  LACTATE DEHYDROGENASE     Status: Normal   Collection Time   04/25/12  9:25 AM      Component Value Range   LDH 170  94 - 250 U/L  URIC ACID     Status: Normal   Collection Time   04/25/12  9:25 AM      Component Value Range   Uric Acid, Serum 5.9  2.4 - 7.0 mg/dL  CBC     Status: Abnormal   Collection Time   04/25/12  9:25 AM      Component Value Range   WBC 14.7 (*) 4.0 - 10.5 K/uL   RBC 4.11  3.87 - 5.11 MIL/uL   Hemoglobin 11.7 (*) 12.0 - 15.0 g/dL   HCT 91.4 (*) 78.2 - 95.6 %   MCV 85.6  78.0 - 100.0 fL   MCH 28.5  26.0 - 34.0 pg   MCHC 33.2  30.0 - 36.0 g/dL   RDW 21.3  08.6 - 57.8 %   Platelets 249  150 - 400 K/uL  URINALYSIS, ROUTINE W REFLEX MICROSCOPIC     Status: Abnormal   Collection Time   04/25/12 11:45 AM      Component Value Range   Color, Urine YELLOW  YELLOW  APPearance CLEAR  CLEAR   Specific Gravity, Urine >1.030 (*) 1.005 - 1.030   pH 5.5  5.0 - 8.0   Glucose, UA NEGATIVE  NEGATIVE mg/dL   Hgb urine dipstick LARGE (*) NEGATIVE   Bilirubin Urine NEGATIVE  NEGATIVE   Ketones, ur 15 (*) NEGATIVE mg/dL   Protein, ur NEGATIVE  NEGATIVE mg/dL   Urobilinogen, UA 0.2  0.0 - 1.0 mg/dL   Nitrite NEGATIVE  NEGATIVE   Leukocytes, UA NEGATIVE  NEGATIVE  URINE MICROSCOPIC-ADD ON     Status: Abnormal   Collection Time   04/25/12 11:45 AM      Component Value Range   Squamous Epithelial / LPF FEW (*) RARE   WBC, UA 0-2  <3 WBC/hpf   RBC / HPF 7-10  <3 RBC/hpf   Bacteria, UA FEW (*) RARE   Urine-Other MUCOUS PRESENT        Assessment / Plan: Spontaneous labor, progressing normally BP slightly improved after getting in tub, but still remains slightly elevation, no sx's pre-eclampsia Labs WNL UA neg for protein  +ketones sg =1.030 I recommend IVF bolus of D5LR for hydration, and to keep saline lock, pt undecided   Labor: Progressing normally Preeclampsia:  no signs or symptoms of toxicity Fetal Wellbeing:  Category I Pain Control:  Labor support without medications I/D:  n/a Anticipated MOD:  NSVD  Denice Cardon M 04/25/2012, 1:17 PM

## 2012-04-25 NOTE — Anesthesia Procedure Notes (Signed)
Epidural Patient location during procedure: OB Start time: 04/25/2012 10:50 PM End time: 04/25/2012 10:59 PM  Staffing Anesthesiologist: Sandrea Hughs  Preanesthetic Checklist Completed: patient identified, site marked, surgical consent, pre-op evaluation, timeout performed, IV checked, risks and benefits discussed and monitors and equipment checked  Epidural Patient position: sitting Prep: site prepped and draped and DuraPrep Patient monitoring: continuous pulse ox and blood pressure Approach: midline Injection technique: LOR air  Needle:  Needle type: Tuohy  Needle gauge: 17 G Needle length: 9 cm and 9 Needle insertion depth: 5 cm cm Catheter type: closed end flexible Catheter size: 19 Gauge Catheter at skin depth: 10 cm Test dose: negative and Other  Assessment Sensory level: T8 Events: blood not aspirated, injection not painful, no injection resistance, negative IV test and no paresthesia  Additional Notes Reason for block:procedure for pain

## 2012-04-26 ENCOUNTER — Encounter (HOSPITAL_COMMUNITY)
Admission: AD | Disposition: A | Discharge status: Home / Self Care | Payer: Self-pay | Source: Ambulatory Visit | Attending: Obstetrics and Gynecology

## 2012-04-26 ENCOUNTER — Inpatient Hospital Stay (HOSPITAL_COMMUNITY): Payer: BC Managed Care – PPO | Admitting: Anesthesiology

## 2012-04-26 ENCOUNTER — Encounter: Payer: BC Managed Care – PPO | Admitting: Obstetrics and Gynecology

## 2012-04-26 ENCOUNTER — Other Ambulatory Visit: Payer: BC Managed Care – PPO

## 2012-04-26 ENCOUNTER — Encounter (HOSPITAL_COMMUNITY): Payer: Self-pay | Admitting: *Deleted

## 2012-04-26 ENCOUNTER — Encounter (HOSPITAL_COMMUNITY): Payer: Self-pay | Admitting: Anesthesiology

## 2012-04-26 LAB — COMPREHENSIVE METABOLIC PANEL
ALT: 14 U/L (ref 0–35)
Alkaline Phosphatase: 214 U/L — ABNORMAL HIGH (ref 39–117)
CO2: 26 mEq/L (ref 19–32)
Chloride: 97 mEq/L (ref 96–112)
GFR calc Af Amer: 74 mL/min — ABNORMAL LOW (ref >90)
Glucose, Bld: 113 mg/dL — ABNORMAL HIGH (ref 70–99)
Potassium: 4 mEq/L (ref 3.5–5.1)
Sodium: 136 mEq/L (ref 135–145)
Total Protein: 5.6 g/dL — ABNORMAL LOW (ref 6.0–8.3)

## 2012-04-26 LAB — CBC
Hemoglobin: 11.2 g/dL — ABNORMAL LOW (ref 12.0–15.0)
RBC: 3.91 MIL/uL (ref 3.87–5.11)
WBC: 24.3 10*3/uL — ABNORMAL HIGH (ref 4.0–10.5)

## 2012-04-26 LAB — ABO/RH: ABO/RH(D): O POS

## 2012-04-26 SURGERY — Surgical Case
Anesthesia: Epidural | Site: Abdomen | Wound class: Clean Contaminated

## 2012-04-26 MED ORDER — CHLOROPROCAINE HCL 3 % IJ SOLN
INTRAMUSCULAR | Status: AC
  Filled 2012-04-26: qty 20

## 2012-04-26 MED ORDER — PROMETHAZINE HCL 25 MG/ML IJ SOLN
12.5000 mg | Freq: Once | INTRAMUSCULAR | Status: AC
  Administered 2012-04-26: 12.5 mg via INTRAVENOUS
  Filled 2012-04-26: qty 1

## 2012-04-26 MED ORDER — SODIUM BICARBONATE 8.4 % IV SOLN
INTRAVENOUS | Status: AC
  Filled 2012-04-26: qty 50

## 2012-04-26 MED ORDER — SENNOSIDES-DOCUSATE SODIUM 8.6-50 MG PO TABS
2.0000 | ORAL_TABLET | Freq: Every day | ORAL | Status: DC
  Administered 2012-04-27 – 2012-04-28 (×2): 2 via ORAL

## 2012-04-26 MED ORDER — ONDANSETRON HCL 4 MG/2ML IJ SOLN: 4.0000 mg | Freq: Three times a day (TID) | INTRAMUSCULAR | Status: DC | PRN

## 2012-04-26 MED ORDER — OXYCODONE-ACETAMINOPHEN 5-325 MG PO TABS: 1.0000 | ORAL_TABLET | ORAL | Status: DC | PRN

## 2012-04-26 MED ORDER — MORPHINE SULFATE 0.5 MG/ML IJ SOLN
INTRAMUSCULAR | Status: AC
  Filled 2012-04-26: qty 10

## 2012-04-26 MED ORDER — CHLOROPROCAINE HCL 3 % IJ SOLN
INTRAMUSCULAR | Status: DC | PRN
  Administered 2012-04-26: 5 mL via EPIDURAL
  Administered 2012-04-26: 4 mL

## 2012-04-26 MED ORDER — LIDOCAINE-EPINEPHRINE (PF) 2 %-1:200000 IJ SOLN
INTRAMUSCULAR | Status: AC
  Filled 2012-04-26: qty 20

## 2012-04-26 MED ORDER — PRENATAL MULTIVITAMIN CH
1.0000 | ORAL_TABLET | Freq: Every day | ORAL | Status: DC
  Administered 2012-04-27 – 2012-04-29 (×3): 1 via ORAL
  Filled 2012-04-26 (×3): qty 1

## 2012-04-26 MED ORDER — MEPERIDINE HCL 25 MG/ML IJ SOLN: 6.2500 mg | INTRAMUSCULAR | Status: DC | PRN

## 2012-04-26 MED ORDER — NALBUPHINE HCL 10 MG/ML IJ SOLN: 5.0000 mg | INTRAMUSCULAR | Status: DC | PRN

## 2012-04-26 MED ORDER — SIMETHICONE 80 MG PO CHEW: 80.0000 mg | CHEWABLE_TABLET | ORAL | Status: DC | PRN

## 2012-04-26 MED ORDER — LACTATED RINGERS IV SOLN
INTRAVENOUS | Status: DC | PRN
  Administered 2012-04-26: 17:00:00 via INTRAVENOUS

## 2012-04-26 MED ORDER — 0.9 % SODIUM CHLORIDE (POUR BTL) OPTIME
TOPICAL | Status: DC | PRN
  Administered 2012-04-26: 1000 mL

## 2012-04-26 MED ORDER — FENTANYL CITRATE 0.05 MG/ML IJ SOLN: 25.0000 ug | INTRAMUSCULAR | Status: DC | PRN

## 2012-04-26 MED ORDER — SIMETHICONE 80 MG PO CHEW
80.0000 mg | CHEWABLE_TABLET | Freq: Three times a day (TID) | ORAL | Status: DC
  Administered 2012-04-27 – 2012-04-29 (×9): 80 mg via ORAL

## 2012-04-26 MED ORDER — MENTHOL 3 MG MT LOZG: 1.0000 | LOZENGE | OROMUCOSAL | Status: DC | PRN

## 2012-04-26 MED ORDER — LACTATED RINGERS IV SOLN
INTRAVENOUS | Status: DC | PRN
  Administered 2012-04-26 (×3): via INTRAVENOUS

## 2012-04-26 MED ORDER — SCOPOLAMINE 1 MG/3DAYS TD PT72
MEDICATED_PATCH | TRANSDERMAL | Status: AC
  Administered 2012-04-26: 1.5 mg via TRANSDERMAL
  Filled 2012-04-26: qty 1

## 2012-04-26 MED ORDER — LANOLIN HYDROUS EX OINT: 1.0000 "application " | TOPICAL_OINTMENT | CUTANEOUS | Status: DC | PRN

## 2012-04-26 MED ORDER — SCOPOLAMINE 1 MG/3DAYS TD PT72
1.0000 | MEDICATED_PATCH | Freq: Once | TRANSDERMAL | Status: AC
  Administered 2012-04-26: 1.5 mg via TRANSDERMAL

## 2012-04-26 MED ORDER — OXYTOCIN 40 UNITS IN LACTATED RINGERS INFUSION - SIMPLE MED
1.0000 m[IU]/min | INTRAVENOUS | Status: DC
  Administered 2012-04-26: 2 m[IU]/min via INTRAVENOUS

## 2012-04-26 MED ORDER — PHENYLEPHRINE 40 MCG/ML (10ML) SYRINGE FOR IV PUSH (FOR BLOOD PRESSURE SUPPORT)
PREFILLED_SYRINGE | INTRAVENOUS | Status: AC
  Filled 2012-04-26: qty 5

## 2012-04-26 MED ORDER — KETOROLAC TROMETHAMINE 30 MG/ML IJ SOLN: 30.0000 mg | Freq: Four times a day (QID) | INTRAMUSCULAR | Status: AC | PRN

## 2012-04-26 MED ORDER — FENTANYL 12 MCG/HR TD PT72
12.5000 ug | MEDICATED_PATCH | TRANSDERMAL | Status: DC | PRN
  Filled 2012-04-26: qty 1

## 2012-04-26 MED ORDER — BUPIVACAINE-EPINEPHRINE 0.5% -1:200000 IJ SOLN
INTRAMUSCULAR | Status: DC | PRN
  Administered 2012-04-26: 10 mL
  Administered 2012-04-26: 20 mL

## 2012-04-26 MED ORDER — NALOXONE HCL 0.4 MG/ML IJ SOLN: 1.0000 ug/kg/h | INTRAMUSCULAR | Status: DC | PRN

## 2012-04-26 MED ORDER — OXYTOCIN 10 UNIT/ML IJ SOLN
40.0000 [IU] | INTRAVENOUS | Status: DC | PRN
  Administered 2012-04-26: 40 [IU] via INTRAVENOUS

## 2012-04-26 MED ORDER — DIPHENHYDRAMINE HCL 25 MG PO CAPS: 25.0000 mg | ORAL_CAPSULE | ORAL | Status: DC | PRN

## 2012-04-26 MED ORDER — SODIUM BICARBONATE 8.4 % IV SOLN
INTRAVENOUS | Status: DC | PRN
  Administered 2012-04-26: 5 mL via EPIDURAL

## 2012-04-26 MED ORDER — PHENYLEPHRINE HCL 10 MG/ML IJ SOLN
INTRAMUSCULAR | Status: DC | PRN
  Administered 2012-04-26: 80 ug via INTRAVENOUS
  Administered 2012-04-26: 40 ug via INTRAVENOUS
  Administered 2012-04-26: 80 ug via INTRAVENOUS
  Administered 2012-04-26: 40 ug via INTRAVENOUS
  Administered 2012-04-26 (×3): 80 ug via INTRAVENOUS

## 2012-04-26 MED ORDER — NALOXONE HCL 0.4 MG/ML IJ SOLN: 0.4000 mg | INTRAMUSCULAR | Status: DC | PRN

## 2012-04-26 MED ORDER — DIPHENHYDRAMINE HCL 50 MG/ML IJ SOLN: 12.5000 mg | INTRAMUSCULAR | Status: DC | PRN

## 2012-04-26 MED ORDER — OXYTOCIN 40 UNITS IN LACTATED RINGERS INFUSION - SIMPLE MED: 62.5000 mL/h | INTRAVENOUS | Status: AC

## 2012-04-26 MED ORDER — FENTANYL CITRATE 0.05 MG/ML IJ SOLN
INTRAMUSCULAR | Status: AC
  Filled 2012-04-26: qty 2

## 2012-04-26 MED ORDER — ONDANSETRON HCL 4 MG/2ML IJ SOLN
INTRAMUSCULAR | Status: DC | PRN
  Administered 2012-04-26: 4 mg via INTRAVENOUS

## 2012-04-26 MED ORDER — MEDROXYPROGESTERONE ACETATE 150 MG/ML IM SUSP: 150.0000 mg | INTRAMUSCULAR | Status: DC | PRN

## 2012-04-26 MED ORDER — IBUPROFEN 600 MG PO TABS
600.0000 mg | ORAL_TABLET | Freq: Four times a day (QID) | ORAL | Status: DC
  Administered 2012-04-27 – 2012-04-29 (×12): 600 mg via ORAL
  Filled 2012-04-26 (×11): qty 1

## 2012-04-26 MED ORDER — ONDANSETRON HCL 4 MG PO TABS: 4.0000 mg | ORAL_TABLET | ORAL | Status: DC | PRN

## 2012-04-26 MED ORDER — TETANUS-DIPHTH-ACELL PERTUSSIS 5-2.5-18.5 LF-MCG/0.5 IM SUSP
0.5000 mL | Freq: Once | INTRAMUSCULAR | Status: AC
  Administered 2012-04-28: 0.5 mL via INTRAMUSCULAR
  Filled 2012-04-26: qty 0.5

## 2012-04-26 MED ORDER — ZOLPIDEM TARTRATE 5 MG PO TABS: 5.0000 mg | ORAL_TABLET | Freq: Every evening | ORAL | Status: DC | PRN

## 2012-04-26 MED ORDER — SODIUM CHLORIDE 0.9 % IJ SOLN
3.0000 mL | INTRAMUSCULAR | Status: DC | PRN
  Administered 2012-04-27 – 2012-04-28 (×2): 3 mL via INTRAVENOUS

## 2012-04-26 MED ORDER — IBUPROFEN 600 MG PO TABS: 600.0000 mg | ORAL_TABLET | Freq: Four times a day (QID) | ORAL | Status: DC | PRN

## 2012-04-26 MED ORDER — METOCLOPRAMIDE HCL 5 MG/ML IJ SOLN
INTRAMUSCULAR | Status: AC
  Filled 2012-04-26: qty 2

## 2012-04-26 MED ORDER — MORPHINE SULFATE (PF) 0.5 MG/ML IJ SOLN
INTRAMUSCULAR | Status: DC | PRN
  Administered 2012-04-26: 4 ug via EPIDURAL

## 2012-04-26 MED ORDER — CEFAZOLIN SODIUM-DEXTROSE 2-3 GM-% IV SOLR
INTRAVENOUS | Status: AC
  Filled 2012-04-26: qty 50

## 2012-04-26 MED ORDER — TERBUTALINE SULFATE 1 MG/ML IJ SOLN: 0.2500 mg | Freq: Once | INTRAMUSCULAR | Status: DC | PRN

## 2012-04-26 MED ORDER — MEASLES, MUMPS & RUBELLA VAC ~~LOC~~ INJ: 0.5000 mL | INJECTION | Freq: Once | SUBCUTANEOUS | Status: DC

## 2012-04-26 MED ORDER — ONDANSETRON HCL 4 MG/2ML IJ SOLN: 4.0000 mg | INTRAMUSCULAR | Status: DC | PRN

## 2012-04-26 MED ORDER — KETOROLAC TROMETHAMINE 60 MG/2ML IM SOLN
60.0000 mg | Freq: Once | INTRAMUSCULAR | Status: AC | PRN
  Administered 2012-04-26: 60 mg via INTRAMUSCULAR

## 2012-04-26 MED ORDER — DIPHENHYDRAMINE HCL 50 MG/ML IJ SOLN: 25.0000 mg | INTRAMUSCULAR | Status: DC | PRN

## 2012-04-26 MED ORDER — CEFAZOLIN SODIUM-DEXTROSE 2-3 GM-% IV SOLR
INTRAVENOUS | Status: DC | PRN
  Administered 2012-04-26: 2 g via INTRAVENOUS

## 2012-04-26 MED ORDER — LACTATED RINGERS IV SOLN
INTRAVENOUS | Status: DC
  Administered 2012-04-26 – 2012-04-28 (×4): via INTRAVENOUS

## 2012-04-26 MED ORDER — CALCIUM CARBONATE ANTACID 500 MG PO CHEW
2.0000 | CHEWABLE_TABLET | ORAL | Status: DC | PRN
  Administered 2012-04-26: 400 mg via ORAL
  Filled 2012-04-26: qty 2

## 2012-04-26 MED ORDER — METOCLOPRAMIDE HCL 5 MG/ML IJ SOLN: 10.0000 mg | Freq: Three times a day (TID) | INTRAMUSCULAR | Status: DC | PRN

## 2012-04-26 MED ORDER — KETOROLAC TROMETHAMINE 60 MG/2ML IM SOLN
INTRAMUSCULAR | Status: AC
  Administered 2012-04-26: 60 mg via INTRAMUSCULAR
  Filled 2012-04-26: qty 2

## 2012-04-26 MED ORDER — LIDOCAINE-EPINEPHRINE (PF) 2 %-1:200000 IJ SOLN
INTRAMUSCULAR | Status: DC | PRN
  Administered 2012-04-26: 7 mL via INTRADERMAL
  Administered 2012-04-26: 3 mL via INTRADERMAL

## 2012-04-26 MED ORDER — DIBUCAINE 1 % RE OINT: 1.0000 "application " | TOPICAL_OINTMENT | RECTAL | Status: DC | PRN

## 2012-04-26 MED ORDER — DIPHENHYDRAMINE HCL 25 MG PO CAPS: 25.0000 mg | ORAL_CAPSULE | Freq: Four times a day (QID) | ORAL | Status: DC | PRN

## 2012-04-26 MED ORDER — WITCH HAZEL-GLYCERIN EX PADS: 1.0000 "application " | MEDICATED_PAD | CUTANEOUS | Status: DC | PRN

## 2012-04-26 MED ORDER — FENTANYL CITRATE 0.05 MG/ML IJ SOLN
INTRAMUSCULAR | Status: DC | PRN
  Administered 2012-04-26: 100 ug via INTRAVENOUS

## 2012-04-26 MED ORDER — ONDANSETRON HCL 4 MG/2ML IJ SOLN
INTRAMUSCULAR | Status: AC
  Filled 2012-04-26: qty 2

## 2012-04-26 MED ORDER — METOCLOPRAMIDE HCL 5 MG/ML IJ SOLN
INTRAMUSCULAR | Status: DC | PRN
  Administered 2012-04-26: 10 mg via INTRAVENOUS

## 2012-04-26 MED ORDER — LABETALOL HCL 5 MG/ML IV SOLN
10.0000 mg | INTRAVENOUS | Status: DC | PRN
  Administered 2012-04-26: 10 mg via INTRAVENOUS
  Administered 2012-04-26: 20 mg via INTRAVENOUS
  Administered 2012-04-26: 10 mg via INTRAVENOUS
  Filled 2012-04-26: qty 8
  Filled 2012-04-26 (×2): qty 4

## 2012-04-26 SURGICAL SUPPLY — 47 items
CLOTH BEACON ORANGE TIMEOUT ST (SAFETY) ×2 IMPLANT
CONTAINER PREFILL 10% NBF 15ML (MISCELLANEOUS) IMPLANT
DRAIN JACKSON PRT FLT 7MM (DRAIN) IMPLANT
DRAPE SURG 17X23 STRL (DRAPES) ×2 IMPLANT
DRESSING TELFA 8X3 (GAUZE/BANDAGES/DRESSINGS) ×1 IMPLANT
DRSG COVADERM 4X10 (GAUZE/BANDAGES/DRESSINGS) IMPLANT
DURAPREP 26ML APPLICATOR (WOUND CARE) ×2 IMPLANT
ELECT REM PT RETURN 9FT ADLT (ELECTROSURGICAL) ×2
ELECTRODE REM PT RTRN 9FT ADLT (ELECTROSURGICAL) ×1 IMPLANT
EVACUATOR SILICONE 100CC (DRAIN) IMPLANT
EXTRACTOR VACUUM M CUP 4 TUBE (SUCTIONS) ×1 IMPLANT
GAUZE SPONGE 4X4 12PLY STRL LF (GAUZE/BANDAGES/DRESSINGS) ×3 IMPLANT
GLOVE BIO SURGEON STRL SZ 6.5 (GLOVE) ×1 IMPLANT
GLOVE BIOGEL PI IND STRL 6.5 (GLOVE) IMPLANT
GLOVE BIOGEL PI IND STRL 7.0 (GLOVE) IMPLANT
GLOVE BIOGEL PI IND STRL 8.5 (GLOVE) ×1 IMPLANT
GLOVE BIOGEL PI INDICATOR 6.5 (GLOVE) ×2
GLOVE BIOGEL PI INDICATOR 7.0 (GLOVE) ×1
GLOVE BIOGEL PI INDICATOR 8.5 (GLOVE) ×1
GLOVE ECLIPSE 8.0 STRL XLNG CF (GLOVE) ×4 IMPLANT
GOWN PREVENTION PLUS LG XLONG (DISPOSABLE) ×4 IMPLANT
GOWN PREVENTION PLUS XXLARGE (GOWN DISPOSABLE) ×2 IMPLANT
KIT ABG SYR 3ML LUER SLIP (SYRINGE) IMPLANT
NDL HYPO 25X1 1.5 SAFETY (NEEDLE) ×1 IMPLANT
NDL HYPO 25X5/8 SAFETYGLIDE (NEEDLE) IMPLANT
NEEDLE HYPO 25X1 1.5 SAFETY (NEEDLE) ×2 IMPLANT
NEEDLE HYPO 25X5/8 SAFETYGLIDE (NEEDLE) IMPLANT
PACK C SECTION WH (CUSTOM PROCEDURE TRAY) ×2 IMPLANT
PAD ABD 7.5X8 STRL (GAUZE/BANDAGES/DRESSINGS) ×2 IMPLANT
PAD OB MATERNITY 4.3X12.25 (PERSONAL CARE ITEMS) ×1 IMPLANT
RINGERS IRRIG 1000ML POUR BTL (IV SOLUTION) ×2 IMPLANT
SLEEVE SCD COMPRESS KNEE MED (MISCELLANEOUS) ×1 IMPLANT
STAPLER VISISTAT 35W (STAPLE) IMPLANT
SUT MNCRL AB 3-0 PS2 27 (SUTURE) ×1 IMPLANT
SUT PLAIN 0 NONE (SUTURE) IMPLANT
SUT SILK 3 0 FS 1X18 (SUTURE) IMPLANT
SUT VIC AB 0 CT1 27 (SUTURE) ×4
SUT VIC AB 0 CT1 27XBRD ANBCTR (SUTURE) ×2 IMPLANT
SUT VIC AB 2-0 CTX 36 (SUTURE) ×4 IMPLANT
SUT VIC AB 3-0 CT1 27 (SUTURE)
SUT VIC AB 3-0 CT1 TAPERPNT 27 (SUTURE) IMPLANT
SUT VIC AB 3-0 SH 27 (SUTURE)
SUT VIC AB 3-0 SH 27X BRD (SUTURE) IMPLANT
SYR CONTROL 10ML LL (SYRINGE) ×2 IMPLANT
TOWEL OR 17X24 6PK STRL BLUE (TOWEL DISPOSABLE) ×4 IMPLANT
TRAY FOLEY CATH 14FR (SET/KITS/TRAYS/PACK) ×1 IMPLANT
WATER STERILE IRR 1000ML POUR (IV SOLUTION) ×1 IMPLANT

## 2012-04-26 NOTE — Progress Notes (Signed)
Patient ID: Susan Zamora, female   DOB: Jul 22, 1981, 31 y.o.   MRN: [id number] .Subjective: Pt sleeping soundly    Objective: BP 133/77  Pulse 99  Temp 98.7 F (37.1 C) (Oral)  Resp 16  Ht 5\' 2"  (1.575 m)  Wt 175 lb (79.379 kg)  BMI 32.01 kg/m2  LMP 07/07/2011  Last BP 115/56   FHT:  FHR: 130 bpm, variability: moderate,  accelerations:  Present,  decelerations:  Present occ mild variable UC:   irregular, every 3-5  minutes SVE:   Dilation: Lip/rim Effacement (%): 100 Station: -1 Exam by:: Susan Zamora,cnm  Deferred at present  Assessment / Plan: FHR reassuring BP improved Pt left undisturbed, will reevaluate when pt wakes up, or PRN    Fetal Wellbeing:  Category I Pain Control:  Epidural  Update physician PRN  Susan Zamora 04/26/2012, 2:35 AM

## 2012-04-26 NOTE — Progress Notes (Signed)
Thepatient has pushed for 4 hours and has had failure to descend Vx O VSS BP 128/92  Pulse 105  Temp 98.6 F (37 C) (Oral)  Resp 18  Ht 5\' 2"  (1.575 m)  Wt 175 lb (79.379 kg)  BMI 32.01 kg/m2  SpO2 93%  LMP 07/07/2011 24 hrs urine continues      fhts category 2 - early and variables with head compression.      abd soft between uc      Contractions  1 : 3 mins and pitocin has been off since 15.20hrs      Vag 10 /100/+1      FTD    Dr Stefano Gaul has been informed of lack of descent and assessment for plan for PLTCS  A  Vx FTD P Prep for FTD  Earl Gala, CNM.

## 2012-04-26 NOTE — Anesthesia Postprocedure Evaluation (Signed)
Anesthesia Post Note  Patient: Susan Zamora  Procedure(s) Performed: Procedure(s) (LRB): CESAREAN SECTION (N/A)  Anesthesia type: Epidural  Patient location: PACU  Post pain: Pain level controlled  Post assessment: Post-op Vital signs reviewed  Last Vitals:  Filed Vitals:   04/26/12 1830  BP: 136/78  Pulse: 97  Temp: 37.4 C  Resp: 20    Post vital signs: stable  Level of consciousness: awake  Complications: No apparent anesthesia complications

## 2012-04-26 NOTE — Progress Notes (Signed)
Comfortable with top up of Epidural O VSS     Temp:  [96.7 F (35.9 C)-99.1 F (37.3 C)] 99.1 F (37.3 C) (09/26 0757) Pulse Rate:  [68-137] 81  (09/26 0831) Resp:  [12-20] 20  (09/26 0803) BP: (113-165)/(55-121) 130/82 mmHg (09/26 0803) SpO2:  [98 %-100 %] 98 % (09/26 0831) Weight:  [175 lb (79.379 kg)] 175 lb (79.379 kg) (09/25 1704)       Fhts category 1 Baseline: 135 bpm      Abd soft between uc      Contractions  1: 4 mins - augmentation with Pitocin -  Increase 2x2 milliunits/ min      SVE: anterior rim vx has descended to +2 station.               IUPC placed to monitor contraction pattern.  High Fowlers A: Slow descent of Vx but now Vx is descending well     Pitocin to augment Ctx pattern.  P Continue care - monitor for Ctx pattern and descent  Earl Gala, [place].

## 2012-04-26 NOTE — Progress Notes (Signed)
Discussion with pt about poc for possible c/s, pt aware of no progression of decent. No possibility of vacuum. Pt verbalized understanding. Susan Zamora will call Dr Stefano Gaul to come to speak with pt.

## 2012-04-26 NOTE — Progress Notes (Addendum)
The patient had been pushing for 3.5 hours and pushing with each UC. Minimal change in descent of Vx O VSS BP 183/131  Pulse 130  Temp 99.7 F (37.6 C) (Oral)  Resp 20  Ht 5\' 2"  (1.575 m)  Wt 175 lb (79.379 kg)  BMI 32.01 kg/m2  SpO2 93%  LMP 07/07/2011       fhts category 1, baseline 135bpm      abd soft between uc      Contractions 1: 2 - 3 mins and continues on Pitocin to augment      Vag: 10 /100% +1 with no Descent of Vx even with Good maternal effort.      Dr Stefano Gaul has been called to make him aware of the lack of progress and that FHT is Reactive Cat 1     Also ware that she has been pushing for 3/5 hrs and Vx has not decended.  Will assess in 30 mins A  Protracted 2nd stage of labor and Vx not descending P Continue care, top up epidural for patient comfort.  Earl Gala, CNM.

## 2012-04-26 NOTE — Progress Notes (Signed)
Patient ID: Susan Zamora, female   DOB: May 16, 1981, 31 y.o.   MRN: [id number] .Subjective:  Has had episodes of vomiting and reflux, rcv'd PO tums and now improved  Denies any pain, some suprapubic pressure   Objective: BP 146/101  Pulse 111  Temp 98.9 F (37.2 C) (Oral)  Resp 18  Ht 5\' 2"  (1.575 m)  Wt 175 lb (79.379 kg)  BMI 32.01 kg/m2  LMP 07/07/2011  Filed Vitals:   04/26/12 0303 04/26/12 0333 04/26/12 0403 04/26/12 0431  BP: 147/114 163/96 146/101   Pulse: 125 125 111   Temp:    98.9 F (37.2 C)  TempSrc:    Oral  Resp: 18 18  18   Height:      Weight:         FHT:  FHR: 130 bpm, variability: moderate,  accelerations:  Present,  decelerations:  Absent UC:   regular, every 4-5 minutes SVE:   Dilation: Lip/rim Effacement (%): 100 Station: -1 Exam by:: Susan Zamora,CNM  VE deferred   Assessment / Plan: PIH vs. pre-eclampsia Pt has greed to: Labetalol IVP foley placement and 24 hour urine  Maternal tachycardia noted, no s/s sepis, will continue to observe   Pt declines VE at present,  Will allow for laboring down if maternal and fetal status are reassuring, pt agrees w plan    Fetal Wellbeing:  Category I Pain Control:  Epidural  Update physician PRN  [name] 04/26/2012, 4:54 AM

## 2012-04-26 NOTE — Progress Notes (Addendum)
Comfortable, some pressure and vx at +1 - +2 station The patient now wants to start pushing and has assumed the squatting position to assist with Vx descent. O VSS BP 147/93  Pulse 104  Temp 97.7 F (36.5 C) (Oral)  Resp 20  Ht 5\' 2"  (1.575 m)  Wt 175 lb (79.379 kg)  BMI 32.01 kg/m2  SpO2 98%  LMP 07/07/2011       fhts category 1 Baseline 135 bpm      abd soft between uc      Contractions 1 : 2 -3 mins  Epidural continues  Pushing with each contraction      Vag Vx: at +1 - +2 A The patient is now fully dilated and squatting and pushing with each contraction P  Pushing with each Uc and coping well but has persistent nausea throughout the labor.  Earl Gala, CNM.

## 2012-04-26 NOTE — Progress Notes (Signed)
The patient has tired from pushing and still unable to deliver infant. She is now ready to proceed with cesarean section. The risk and benefits were reviewed including, but not limited to, anesthetic complications, bleeding, infections, and possible damage to the surrounding organs.  Dr. Stefano Gaul

## 2012-04-26 NOTE — Progress Notes (Signed)
This note also relates to the following rows which could not be included: Dose (milli-units/min) Oxytocin - Cannot attach notes to extension rows Rate (mL/hr) Oxytocin - Cannot attach notes to extension rows Concentration Oxytocin - Cannot attach notes to extension rows   Pt verbalizing desire for c/s now, awaiting dr Stefano Gaul to come to talk to pt. rn spoke with dee- cnm, orders received to d/c pitocin - pt agreeable to that. Awaiting dr Rodman Pickle to come to dose patient for comfort until c/s is called.

## 2012-04-26 NOTE — Transfer of Care (Signed)
Immediate Anesthesia Transfer of Care Note  Patient: Susan Zamora  Procedure(s) Performed: Procedure(s) (LRB) with comments: CESAREAN SECTION (N/A) - Primary Cesarean Section [facility] Baby Boy @ [phone],  Patient Location: PACU  Anesthesia Type: Epidural  Level of Consciousness: awake  Airway & Oxygen Therapy: Patient Spontanous Breathing  Post-op Assessment: Report given to PACU RN and Post -op Vital signs reviewed and stable  Post vital signs: stable  Complications: No apparent anesthesia complications

## 2012-04-26 NOTE — Op Note (Signed)
OPERATIVE NOTE  Patient's Name: Susan Zamora  Date of Birth: Oct 29, 1980  Medical Records Number: [id number]  Date of Operation: 04/26/2012  Preoperative diagnosis:  [redacted]w[redacted]d weeks gestation  Arrest of Descent  Postoperative diagnosis:  [redacted]w[redacted]d weeks gestation  Arrest of Descent  Right occiput transverse presentation  Fibroid uterus  Procedure:  Primary low transverse cesarean section  Surgeon:  Leonard Schwartz, M.D.  Assistant:  Paulino Door, certified nurse midwife  Anesthesia:  Epidural  Disposition:  [name] is a 31 y.o. female, G1P1001, who presents at [redacted]w[redacted]d weeks gestation. The patient has been followed at the [facility] obstetrics and gynecology division of [facility] health care for women. She has had rupture membranes for 24 hours. She has not been able to deliver her baby in spite of pushing for hours. She has the above mentioned diagnosis. She understands the indications for her procedure and she accepts the risk of, but not limited to, anesthetic complications, bleeding, infections, and possible damage to the surrounding organs.  Findings:  A female (Rafe) was delivered from a occiput transverse position.  The Apgar scores were 9/9 . The uterus, fallopian tubes, and ovaries were normal for the gravid state except that there was a 3 cm fibroid on the posterior uterus.  Procedure:  The patient was taken to the operating room where a spinal anesthetic was given. The patient's abdomen was prepped with Duraprep. The perineum was prepped with betadine. A Foley catheter was placed in the bladder. The patient was sterilely draped. The lower abdomen was injected with half percent Marcaine with epinephrine. A low transverse incision was made in the abdomen and carried sharply through the subcutaneous tissue, the fascia, and the anterior peritoneum. An incision was made in the lower uterine segment. The incision was extended in a low transverse  fashion. The membranes were ruptured. The fetal head was delivered without difficulty. The mouth and nose were suctioned. The remainder of the infant was then delivered. The cord was clamped and cut. The infant was handed to the awaiting pediatric team. The placenta was removed. The uterine cavity was cleaned of amniotic fluid, clotted blood, and membranes. The uterine incision was closed using a running locking suture of 2-0 Vicryl. An imbricating suture of 2-0 Vicryl was placed. The pelvis was vigorously irrigated. Hemostasis was adequate. The anterior peritoneum and the abdominal musculature were closed using 2-0 Vicryl. The fascia was closed using a running suture of 0 Vicryl followed by 3 interrupted sutures of 0 Vicryl. The subcutaneous layer was closed using interrupted sutures of 2-0 Vicryl. The skin was reapproximated using a subcuticular suture of 3-0 Monocryl. Sponge, needle, and instrument counts were correct on 2 occasions. The estimated blood loss for the procedure was 800 cc. The patient tolerated her procedure well. She was transported to the recovery room in stable condition. The infant was taken to the full-term nursery in stable condition. The placenta was sent to labor and delivery.  Leonard Schwartz, M.D.

## 2012-04-26 NOTE — Progress Notes (Signed)
Patient ID: Susan Zamora, female   DOB: 11/15/1980, 31 y.o.   MRN: [id number] .Subjective: Pt had been resting, now c/o increased pain and more pressure/urge to push    Objective: BP 133/77  Pulse 99  Temp 98.7 F (37.1 C) (Oral)  Resp 16  Ht 5\' 2"  (1.575 m)  Wt 175 lb (79.379 kg)  BMI 32.01 kg/m2  LMP 07/07/2011  BP at 0040=166/111   FHT:  FHR: 130 bpm, variability: moderate,  accelerations:  Present,  decelerations:  Present occ mild variables UC:   regular, every 2-4 minutes SVE:   Dilation: Lip/rim Effacement (%): 100 Station: -1 Exam by:: S.Kaneshia Cater,cnm    Assessment / Plan: no cervical change, vtx in OA position  Elevated BP rv'd findings w pt, and recommended labetalol for continued elevated BP, to begin 24 hour urine collection, rv'd risks of pre-eclampsia/elclampsia  Pt to discuss w sig other and let us know of her decision Will continue to allow laboring down, recheck cervix approx 2hrs or PRN rv'd w Dr Su Hilt    Fetal Wellbeing:  Category I Pain Control:  Epidural  Update physician PRN  [name] 04/26/2012, 2:30 AM

## 2012-04-26 NOTE — Progress Notes (Signed)
Patient ID: Susan Zamora, female   DOB: Jan 23, 1981, 31 y.o.   MRN: [id number] .Subjective:  C/o more back pain w ctx, breathing through them, no strong urge to push, anesthesia en route to evaluate, had recent episodes of vomiting   Objective: BP 132/112  Pulse 85  Temp 98.9 F (37.2 C) (Oral)  Resp 18  Ht 5\' 2"  (1.575 m)  Wt 175 lb (79.379 kg)  BMI 32.01 kg/m2  LMP 07/07/2011 Filed Vitals:   04/26/12 0703 04/26/12 0719 04/26/12 0721 04/26/12 0724  BP: 165/106 142/100 132/112   Pulse: 101 98 92 85  Temp:      TempSrc:      Resp:  18    Height:      Weight:         FHT:  FHR: 130 bpm, variability: moderate,  accelerations:  Present,  decelerations:  Absent tracing MHR at times UC:   regular, every 3-4  minutes SVE:   Dilation: Lip/rim Effacement (%): 100 Station: -1 Exam by:: Yoni Lobos,CNM    Assessment / Plan: Arrest in active phase of labor PIH vs. Pre-eclampsia,  BP's increased again, just now has rcv'd several doses of IV labetalol,  24 hour urine remains in progress No cervical change, ant lip remains,  Only mod pressure of vtx w ctx, mod caput,   rv'd options including: IUPC, pitocin augmentation, CTO, or proceeding w c/s rv'd risks and benefits, including risks of maternal/fetal compromise w continued labor Pt and sig other would like to discuss, will let us know about any decisions     Fetal Wellbeing:  Category I Pain Control:  Epidural  Dr Su Hilt updated   [name] 04/26/2012, 7:27 AM

## 2012-04-27 ENCOUNTER — Other Ambulatory Visit: Payer: BC Managed Care – PPO

## 2012-04-27 ENCOUNTER — Encounter (HOSPITAL_COMMUNITY): Payer: Self-pay | Admitting: Obstetrics and Gynecology

## 2012-04-27 LAB — PROTEIN, URINE, 24 HOUR: Collection Interval-UPROT: 24 hours

## 2012-04-27 LAB — CBC
HCT: 27.5 % — ABNORMAL LOW (ref 36.0–46.0)
MCH: 28.6 pg (ref 26.0–34.0)
MCV: 87.3 fL (ref 78.0–100.0)
Platelets: 211 10*3/uL (ref 150–400)
RBC: 3.15 MIL/uL — ABNORMAL LOW (ref 3.87–5.11)
WBC: 17.4 10*3/uL — ABNORMAL HIGH (ref 4.0–10.5)

## 2012-04-27 MED ORDER — MAGNESIUM SULFATE BOLUS VIA INFUSION
4.0000 g | Freq: Once | INTRAVENOUS | Status: AC
  Administered 2012-04-27: 4 g via INTRAVENOUS
  Filled 2012-04-27: qty 500

## 2012-04-27 MED ORDER — MAGNESIUM SULFATE 40 G IN LACTATED RINGERS - SIMPLE
2.0000 g/h | Freq: Once | INTRAVENOUS | Status: DC
  Filled 2012-04-27: qty 500

## 2012-04-27 MED ORDER — TRAMADOL HCL 50 MG PO TABS
100.0000 mg | ORAL_TABLET | Freq: Four times a day (QID) | ORAL | Status: DC | PRN
  Administered 2012-04-27 – 2012-04-29 (×7): 100 mg via ORAL
  Filled 2012-04-27: qty 2
  Filled 2012-04-27 (×2): qty 1
  Filled 2012-04-27 (×5): qty 2

## 2012-04-27 MED ORDER — ACETAMINOPHEN 500 MG PO TABS: 1000.0000 mg | ORAL_TABLET | ORAL | Status: DC | PRN

## 2012-04-27 NOTE — Addendum Note (Signed)
Addendum  created 04/27/12 1407 by Gertie Fey, CRNA   Modules edited:Notes Section

## 2012-04-27 NOTE — Anesthesia Postprocedure Evaluation (Signed)
  Anesthesia Post-op Note  Patient: Susan Zamora  Procedure(s) Performed: Procedure(s) (LRB) with comments: CESAREAN SECTION (N/A) - Primary Cesarean Section [facility] Baby Boy @ [phone],  Patient Location: PACU and Mother/Baby  Anesthesia Type: Epidural  Level of Consciousness: awake, alert  and oriented  Airway and Oxygen Therapy: Patient Spontanous Breathing  Post-op Pain: mild  Post-op Assessment: Post-op Vital signs reviewed  Post-op Vital Signs: Reviewed and stable  Complications: No apparent anesthesia complications

## 2012-04-27 NOTE — Progress Notes (Addendum)
Subjective: Postpartum Day 1: Cesarean Delivery due to FTD Patient reports doing well.  Ambulated after Foley removed, no syncope or dizziness.  No void yet--Foley just removed this am.  Denies HA, visual symptoms, or epigastric pain. Feeding:  Breast Contraceptive:  Undecided at present.  Objective: Vital signs in last 24 hours: Temp:  [97.7 F (36.5 C)-100.2 F (37.9 C)] 98.6 F (37 C) (09/27 0445) Pulse Rate:  [26-236] 74  (09/27 0445) Resp:  [16-24] 18  (09/27 0445) BP: (103-183)/(51-131) 119/84 mmHg (09/27 0445) SpO2:  [83 %-100 %] 94 % (09/27 0445)  Filed Vitals:   04/26/12 2145 04/27/12 0058 04/27/12 0242 04/27/12 0445  BP: 131/83 123/86 123/82 119/84  Pulse: 100 97 92 74  Temp: 99 F (37.2 C) 99 F (37.2 C) 98.4 F (36.9 C) 98.6 F (37 C)  TempSrc: Axillary Axillary Oral Oral  Resp: 20 18 18 18   Height:      Weight:      SpO2: 94% 95% 92% 94%     Physical Exam:  General: alert Lochia: appropriate Uterine Fundus: firm Incision: Dressing CDI DVT Evaluation: No evidence of DVT seen on physical exam. Negative Homan's sign. Calf/Ankle edema is present, tr/1+.  DTR 3+, with 1 beat clonus on right.  JP drain:   NA   Basename 04/27/12 0600 04/26/12 1230  HGB 9.0* 11.2*  HCT 27.5* 33.7*   24 hour urine protein = 320 mg.  Assessment/Plan: Status post Cesarean section. Pre-eclampsia Improved BP since delivery.  Consulted with Dr. Pennie Rushing. Will initiate magnesium sulfate x 24 hours. Reviewed plan of care with patient and husband--they are agreeable with it.  Nigel Bridgeman 04/27/2012, 7:59 AM   i likewise reviewed the plan of care for patient and answered her questions.

## 2012-04-27 NOTE — Progress Notes (Signed)
UR chart review completed.  

## 2012-04-28 DIAGNOSIS — O135 Gestational [pregnancy-induced] hypertension without significant proteinuria, complicating the puerperium: Secondary | ICD-10-CM

## 2012-04-28 DIAGNOSIS — IMO0002 Reserved for concepts with insufficient information to code with codable children: Secondary | ICD-10-CM

## 2012-04-28 MED ORDER — NIFEDIPINE ER 30 MG PO TB24
30.0000 mg | ORAL_TABLET | Freq: Every day | ORAL | Status: DC
  Administered 2012-04-28 – 2012-04-29 (×2): 30 mg via ORAL
  Filled 2012-04-28 (×3): qty 1

## 2012-04-28 NOTE — Progress Notes (Signed)
BP 139/92.  Report called to [facility], RN.  Pt. Transferred to room # 110 via wheelchair.

## 2012-04-28 NOTE — Progress Notes (Signed)
Patient ID: Susan Zamora, female   DOB: [birth year], 31 y.o.   MRN: [id number] Subjective: Postpartum Day 2: Cesarean Delivery Patient reports no nausea, vomiting, incisional pain, tolerating PO and no problems voiding.    Objective: Vital signs in last 24 hours: Temp:  [97.6 F (36.4 C)-98.7 F (37.1 C)] 98.6 F (37 C) (09/28 1715) Pulse Rate:  [77-114] 114  (09/28 1715) Resp:  [16-20] 20  (09/28 1715) BP: (133-164)/(85-103) 133/85 mmHg (09/28 1715) SpO2:  [90 %-99 %] 96 % (09/28 1645) Weight:  [167 lb (75.751 kg)] 167 lb (75.751 kg) (09/28 0612)  Physical Exam:  General: alert, cooperative and no distress Lochia: appropriate Uterine Fundus: firm Incision: healing well DVT Evaluation: No evidence of DVT seen on physical exam.   Basename 04/27/12 0600 04/26/12 1230  HGB 9.0* 11.2*  HCT 27.5* 33.7*   I/O last 3 completed shifts: In: 5185.4 [P.O.:2240; I.V.:2945.4] Out: 40981 [Urine:10150] Total I/O In: 100 [P.O.:100] Out: 800 [Urine:800]     Assessment/Plan: Status post Cesarean section. Postoperative course complicated by Preeclampsia. Patient is now status post 24 hours of magnesium therapy with mild blood pressure elevations.  Begin Procardia XL 30 mg daily Transferred to mother-baby unit Anticipate discharge home on 04/29/2012  .  [name] P 04/28/2012, 9:13 PM

## 2012-04-29 MED ORDER — IBUPROFEN 600 MG PO TABS: 600.0000 mg | ORAL_TABLET | Freq: Four times a day (QID) | ORAL | Status: DC | PRN

## 2012-04-29 MED ORDER — TRAMADOL HCL 50 MG PO TABS: 100.0000 mg | ORAL_TABLET | Freq: Four times a day (QID) | ORAL | Status: DC | PRN

## 2012-04-29 MED ORDER — OXYCODONE-ACETAMINOPHEN 5-325 MG PO TABS: 1.0000 | ORAL_TABLET | ORAL | Status: DC | PRN

## 2012-04-29 MED ORDER — NIFEDIPINE ER 60 MG PO TB24: 60.0000 mg | ORAL_TABLET | Freq: Every day | ORAL | Status: DC

## 2012-04-29 MED ORDER — NIFEDIPINE ER 30 MG PO TB24
30.0000 mg | ORAL_TABLET | ORAL | Status: AC
  Administered 2012-04-29: 30 mg via ORAL
  Filled 2012-04-29: qty 1

## 2012-04-29 NOTE — Discharge Summary (Signed)
Obstetric Discharge Summary Reason for Admission: onset of labor Prenatal Procedures: NST and ultrasound Intrapartum Procedures: cesarean: low cervical, transverse Postpartum Procedures: PP magnesium sulfate, Procardia XL Complications-Operative and Postpartum: Elevated BP Hemoglobin  Date Value Range Status  04/27/2012 9.0* 12.0 - 15.0 g/dL Final     DELTA CHECK NOTED     REPEATED TO VERIFY     HCT  Date Value Range Status  04/27/2012 27.5* 36.0 - 46.0 % Final   Hospital Course:  Admitted 04/25/12 in active labor, with plan for waterbirth.  Negative GBS. Progressed slowly over the next 24 hours to complete, but then pushed for 4 hours without accomplishing descent.  BP was also noted to be elevated on admission, with negative PIH lab.  Once epidural was elected, a catheter was placed for collection of 24 hour urine.  Patient was delivered by C/S by Dr. Stefano Gaul on the evening of 9/26.  Patient and baby tolerated the procedure without difficulty.   Infant status was stable at delivery and remained in room with mother.  24 hour urine was resulted on day 1 pp, with 320 mg protein/24 hours.  Magnesium sulfate was then begun for pre-eclampsia treatment and was continued for a 24 hour period.  Patient's BP remained elevated, and she was started on Procardia 30 mg XL, then increased to 60 mg XL daily on the day of d/c.  She was ambulating without difficulty, and was able to be d/c'd home on day 3.  Patient's physical exam was WNL, and she was discharged home in stable condition. Contraception plan was undecided at that time.  She received adequate benefit from po pain medications and was using Motrin, with a request for Ultram as adjunct (preferred that to Percocet).  [facility] was see the patient mid-week after d/c to evaluate BP.    Physical Exam:  General: alert Lochia: appropriate Uterine Fundus: firm Incision: healing well DVT Evaluation: No evidence of DVT seen on physical exam. Negative  Homan's sign. DTR 1+ without clonus, 1+ edema  Discharge Diagnoses: Term Pregnancy-delivered, Preelampsia and Prolonged 2nd stage, primary LT cesarean due to FTD.  Discharge Information: Date: 04/29/2012 Activity: Per [facility] handout Diet: routine Medications: Ibuprofen and Iron, Ultram (as adjunct to Motrin, due to Codeine sensitivity, Procardia 60 mg xl Condition: stable Instructions: refer to practice specific booklet Discharge to: home Contraception:  Undecided at present, declines making decision at present. Follow-up Information    Follow up with [facility]. In 6 weeks. (Call for any questions or concerns)    Contact information:   [address] [place] [address] [phone]         Newborn Data: Live born female  Birth Weight: 8 lb 0.8 oz (3650 g) APGAR: 9, 9  Baby was required to remain for additional period due to elevated temp, so baby will become the patient, but mom will remain with the baby. [facility] nurse to see patient later this week for BP check.  Office to coordinate.  Kharson Rasmusson 04/29/2012, 10:26 AM

## 2012-04-30 ENCOUNTER — Telehealth: Payer: Self-pay | Admitting: Obstetrics and Gynecology

## 2012-04-30 NOTE — Telephone Encounter (Signed)
Message copied by Mason Jim on Mon Apr 30, 2012  9:43 AM ------      Message from: Cornelius Moras      Created: Sun Apr 29, 2012  1:52 PM      Regarding: [facility] visit       Needs smart start nurse visit during the upcoming week.  Patient delivered by C/S on 9/26, with pre-eclampsia and d/c home on Procardia 60 mg XL.            Thanks!      VL

## 2012-04-30 NOTE — Telephone Encounter (Signed)
Message copied by Mason Jim on Mon Apr 30, 2012  8:37 AM ------      Message from: Cornelius Moras      Created: Sun Apr 29, 2012  1:52 PM      Regarding: [facility] visit       Needs smart start nurse visit during the upcoming week.  Patient delivered by C/S on 9/26, with pre-eclampsia and d/c home on Procardia 60 mg XL.            Thanks!      VL

## 2012-04-30 NOTE — Telephone Encounter (Signed)
TC to Rosey Bath, Academic librarian. LM with information. To call to confirm.

## 2012-04-30 NOTE — Telephone Encounter (Signed)
TC fro [employer]Will have [employer] nurse schedule FM.

## 2012-05-02 ENCOUNTER — Telehealth: Payer: Self-pay | Admitting: Obstetrics and Gynecology

## 2012-05-02 NOTE — Telephone Encounter (Signed)
Tc from [place], [facility] [phone].  Pt's BP 134/90. S/P delivery 04/26/12. No PIH sx. Taking Procardia XL 60 mg. Per VL, to have BP rechecked 05/07/12. Tonya and pt notified. Pt to call with PIH sx or any concerns. Pt verbalizes comprehension.

## 2012-05-03 ENCOUNTER — Telehealth: Payer: Self-pay | Admitting: Obstetrics and Gynecology

## 2012-05-03 NOTE — Telephone Encounter (Signed)
sr pt 

## 2012-05-03 NOTE — Telephone Encounter (Signed)
VM from medical services. Has order for breast pump.  Needs to know pt is currently seen here, approved by Dr Lynford Humphrey and office info. [phone] EXt [id number] or EXT [id number]   REF # [id number]

## 2012-05-07 ENCOUNTER — Telehealth: Payer: Self-pay | Admitting: Obstetrics and Gynecology

## 2012-05-07 NOTE — Telephone Encounter (Signed)
Tonya, [facility] called with pt's BP today, was 134/86, pt on Procardia XL 60 mg daily, pt denies any sxs today.  BP reported to [facility], no further BPs needed, pt to call with any concerns.

## 2012-05-16 ENCOUNTER — Telehealth: Payer: Self-pay | Admitting: Obstetrics and Gynecology

## 2012-05-16 NOTE — Telephone Encounter (Signed)
Tc to pt.  Informed may D/C Procardia.  To have BP check at [facility] visit.

## 2012-05-16 NOTE — Telephone Encounter (Signed)
Returned pt's call.  LM to return call. Pt's message questioning if should continue BP med.  Per VL, may D/C.  Will check at [facility] appt 06/07/12.

## 2012-05-16 NOTE — Telephone Encounter (Signed)
Returned pt's call. LM to return call.   

## 2012-05-27 ENCOUNTER — Inpatient Hospital Stay (HOSPITAL_COMMUNITY)
Admission: AD | Admit: 2012-05-27 | Discharge: 2012-05-27 | Disposition: A | Discharge status: Home / Self Care | Payer: BC Managed Care – PPO | Source: Ambulatory Visit | Attending: Obstetrics and Gynecology | Admitting: Obstetrics and Gynecology

## 2012-05-27 ENCOUNTER — Encounter (HOSPITAL_COMMUNITY): Payer: Self-pay | Admitting: Family

## 2012-05-27 DIAGNOSIS — N61 Mastitis without abscess: Secondary | ICD-10-CM

## 2012-05-27 DIAGNOSIS — N644 Mastodynia: Secondary | ICD-10-CM | POA: Insufficient documentation

## 2012-05-27 LAB — URINE MICROSCOPIC-ADD ON

## 2012-05-27 LAB — URINALYSIS, ROUTINE W REFLEX MICROSCOPIC
Glucose, UA: NEGATIVE mg/dL
Ketones, ur: NEGATIVE mg/dL
Specific Gravity, Urine: >1.03 — ABNORMAL HIGH (ref 1.005–1.030)
pH: 6 (ref 5.0–8.0)

## 2012-05-27 NOTE — MAU Note (Signed)
Pt reports clogged milk duct earlier in week; "major pain" began last night; fever began last night. Was given ABX and took ibuprofen for fever. Fever still today and pain worse; Called CNM on call and was told to come in.

## 2012-05-27 NOTE — MAU Note (Signed)
Pt reports having some redness in he right breast begining of the week. Tried hot compress and message but has gotten worse  Ran fever of 100.1 last night Started on keflex yesterday but today is much worse.

## 2012-05-27 NOTE — Progress Notes (Signed)
History  Susan Zamora is a 31 y.o. G1P1001 at Unknown   Chief Complaint  Patient presents with  . Breast Pain  pain started yesterday with fever, chills, feels achy all over like the flu taking keflex, tylenol, motrin   Unknown   Chief Complaint  Patient presents with  . Breast Pain   @SFHPI @  Prior to Admission medications   Medication Sig Start Date End Date Taking? Authorizing Provider  acetaminophen (TYLENOL) 650 MG CR tablet Take 650 mg by mouth every 8 (eight) hours as needed. pain   Yes Historical Provider, MD  cephALEXin (KEFLEX) 500 MG capsule Take 500 mg by mouth 4 (four) times daily. Started 05/27/12 x 10 days   Yes Historical Provider, MD  ibuprofen (ADVIL,MOTRIN) 600 MG tablet Take 1 tablet (600 mg total) by mouth every 6 (six) hours as needed for pain. 04/29/12  Yes Nigel Bridgeman, CNM  Prenatal Vit-Fe Fumarate-FA (PRENATAL MULTIVITAMIN) TABS Take 1 tablet by mouth daily.   Yes Historical Provider, MD    Patient Active Problem List  Diagnosis  . Abnormal Pap smear  . Normal pregnancy, first  . Poor weight gain of pregnancy  . Elevated blood pressure  . Status post primary low transverse cesarean section--FTD  . Pre-eclampsia   Vitals:  Blood pressure 149/91, pulse 123, temperature 100.8 F (38.2 C), temperature source Oral, resp. rate 18, height 5\' 3"  (1.6 m), weight 143 lb 12.8 oz (65.227 kg), currently breastfeeding. OB History    Grav Para Term Preterm Abortions TAB SAB Ect Mult Living   1 1 1  0 0 0 0 0 0 1      Past Surgical History  Procedure Date  . Wisdom tooth extraction 1996    age 70  . Tonsillectomy 1985    age 89  . Cesarean section 04/26/2012    Procedure: CESAREAN SECTION;  Surgeon: Kirkland Hun, MD;  Location: [facility];  Service: Obstetrics;  Laterality: N/A;  Primary Cesarean Section Delivey Baby Boy @ 1643,    Family History  Problem Relation Age of Onset  . Mental illness Mother   . Alcohol abuse Father   . Kidney disease Sister    . Alcohol abuse Sister   . Alcohol abuse Brother     History  Substance Use Topics  . Smoking status: Never Smoker   . Smokeless tobacco: Never Used  . Alcohol Use: No    Allergies:  Allergies  Allergen Reactions  . Codeine Nausea Only    Prescriptions prior to admission  Medication Sig Dispense Refill  . acetaminophen (TYLENOL) 650 MG CR tablet Take 650 mg by mouth every 8 (eight) hours as needed. pain      . cephALEXin (KEFLEX) 500 MG capsule Take 500 mg by mouth 4 (four) times daily. Started 05/27/12 x 10 days      . ibuprofen (ADVIL,MOTRIN) 600 MG tablet Take 1 tablet (600 mg total) by mouth every 6 (six) hours as needed for pain.  36 tablet  2  . Prenatal Vit-Fe Fumarate-FA (PRENATAL MULTIVITAMIN) TABS Take 1 tablet by mouth daily.      Marland Kitchen DISCONTD: NIFEdipine (PROCARDIA-XL/ADALAT CC) 60 MG 24 hr tablet Take 1 tablet (60 mg total) by mouth daily.  30 tablet  3  . DISCONTD: traMADol (ULTRAM) 50 MG tablet Take 2 tablets (100 mg total) by mouth every 6 (six) hours as needed for pain.  36 tablet  2  . DISCONTD: oxyCODONE-acetaminophen (PERCOCET/ROXICET) 5-325 MG per tablet Take 1 tablet by  mouth every 4 (four) hours as needed.  36 tablet  0    @ROS @ Physical Exam  Calm, no distress, cooperative, HEENT WNL grossly,  lungs clear bilaterally, AP RRR,  R breast with area 10 o'clock to 4 red, warm to touch, firm, L breast wnl abd soft nt, incision well approximated no redness, edema, or drainage Scant pink discharge No edema to lower extremities Blood pressure 149/91, pulse 123, temperature 100.8 F (38.2 C), temperature source Oral, resp. rate 18, height 5\' 3"  (1.6 m), weight 143 lb 12.8 oz (65.227 kg), currently breastfeeding.  @PHYSEXAMBYAGE2 @ Labs:  No results found for this or any previous visit (from the past 24 hour(s)). ASSESSMENT: Patient Active Problem List  Diagnosis  . Abnormal Pap smear  . Normal pregnancy, first  . Poor weight gain of pregnancy  . Elevated  blood pressure  . Status post primary low transverse cesarean section--FTD  . Pre-eclampsia    ED Course  Assessment/Plan mastitis 1 month PO cesarean section PIH d/ced procardia last week Continue keflex, moist heat qid, push po fluids, massage area, continue to breastfeed, tylenol, motrin as needed. Report to office if fever and symptoms not significantly improved in 48 hours. Lavera Guise, CNM

## 2012-05-29 ENCOUNTER — Ambulatory Visit (INDEPENDENT_AMBULATORY_CARE_PROVIDER_SITE_OTHER): Payer: BC Managed Care – PPO | Admitting: Obstetrics and Gynecology

## 2012-05-29 ENCOUNTER — Inpatient Hospital Stay (HOSPITAL_COMMUNITY)
Admission: AD | Admit: 2012-05-29 | Discharge: 2012-05-31 | DRG: 376 | Disposition: A | Discharge status: Home / Self Care | Payer: BC Managed Care – PPO | Source: Ambulatory Visit | Attending: General Surgery | Admitting: General Surgery

## 2012-05-29 ENCOUNTER — Encounter (HOSPITAL_COMMUNITY): Admission: AD | Disposition: A | Discharge status: Home / Self Care | Payer: Self-pay | Source: Ambulatory Visit

## 2012-05-29 ENCOUNTER — Encounter (HOSPITAL_COMMUNITY): Payer: Self-pay | Admitting: Anesthesiology

## 2012-05-29 ENCOUNTER — Ambulatory Visit (INDEPENDENT_AMBULATORY_CARE_PROVIDER_SITE_OTHER): Payer: BC Managed Care – PPO | Admitting: Surgery

## 2012-05-29 ENCOUNTER — Observation Stay (HOSPITAL_COMMUNITY): Payer: BC Managed Care – PPO | Admitting: Anesthesiology

## 2012-05-29 ENCOUNTER — Telehealth: Payer: Self-pay | Admitting: Obstetrics and Gynecology

## 2012-05-29 ENCOUNTER — Encounter (INDEPENDENT_AMBULATORY_CARE_PROVIDER_SITE_OTHER): Payer: Self-pay | Admitting: Surgery

## 2012-05-29 ENCOUNTER — Encounter (HOSPITAL_COMMUNITY): Payer: Self-pay | Admitting: *Deleted

## 2012-05-29 ENCOUNTER — Encounter: Payer: Self-pay | Admitting: Obstetrics and Gynecology

## 2012-05-29 VITALS — BP 120/90 | HR 93 | Temp 97.3°F | Ht 63.0 in | Wt 142.2 lb

## 2012-05-29 VITALS — BP 100/70 | Temp 98.3°F | Wt 145.0 lb

## 2012-05-29 DIAGNOSIS — N611 Abscess of the breast and nipple: Secondary | ICD-10-CM

## 2012-05-29 DIAGNOSIS — N61 Mastitis without abscess: Secondary | ICD-10-CM

## 2012-05-29 DIAGNOSIS — O9112 Abscess of breast associated with the puerperium: Principal | ICD-10-CM | POA: Diagnosis present

## 2012-05-29 HISTORY — PX: IRRIGATION AND DEBRIDEMENT ABSCESS: SHX5252

## 2012-05-29 LAB — CBC
MCH: 28 pg (ref 26.0–34.0)
MCHC: 32.6 g/dL (ref 30.0–36.0)
Platelets: 302 10*3/uL (ref 150–400)
RDW: 14.2 % (ref 11.5–15.5)

## 2012-05-29 LAB — CREATININE, SERUM
GFR calc Af Amer: >90 mL/min (ref >90)
GFR calc non Af Amer: >90 mL/min (ref >90)

## 2012-05-29 SURGERY — IRRIGATION AND DEBRIDEMENT ABSCESS
Anesthesia: General | Laterality: Right | Wound class: Dirty or Infected

## 2012-05-29 MED ORDER — METOCLOPRAMIDE HCL 5 MG/ML IJ SOLN
INTRAMUSCULAR | Status: DC | PRN
  Administered 2012-05-29: 10 mg via INTRAVENOUS

## 2012-05-29 MED ORDER — PIPERACILLIN-TAZOBACTAM 3.375 G IVPB
3.3750 g | Freq: Three times a day (TID) | INTRAVENOUS | Status: DC
  Administered 2012-05-29 – 2012-05-31 (×5): 3.375 g via INTRAVENOUS
  Filled 2012-05-29 (×6): qty 50

## 2012-05-29 MED ORDER — FENTANYL CITRATE 0.05 MG/ML IJ SOLN: 25.0000 ug | INTRAMUSCULAR | Status: DC | PRN

## 2012-05-29 MED ORDER — FENTANYL CITRATE 0.05 MG/ML IJ SOLN
INTRAMUSCULAR | Status: DC | PRN
  Administered 2012-05-29 (×5): 50 ug via INTRAVENOUS

## 2012-05-29 MED ORDER — DEXAMETHASONE SODIUM PHOSPHATE 4 MG/ML IJ SOLN
INTRAMUSCULAR | Status: DC | PRN
  Administered 2012-05-29: 10 mg via INTRAVENOUS

## 2012-05-29 MED ORDER — ONDANSETRON HCL 4 MG/2ML IJ SOLN
INTRAMUSCULAR | Status: DC | PRN
  Administered 2012-05-29: 4 mg via INTRAVENOUS

## 2012-05-29 MED ORDER — VANCOMYCIN HCL IN DEXTROSE 1-5 GM/200ML-% IV SOLN
1000.0000 mg | INTRAVENOUS | Status: AC
  Filled 2012-05-29: qty 200

## 2012-05-29 MED ORDER — KCL IN DEXTROSE-NACL 20-5-0.9 MEQ/L-%-% IV SOLN
INTRAVENOUS | Status: DC
  Administered 2012-05-29: 75 mL/h via INTRAVENOUS
  Administered 2012-05-31: 06:00:00 via INTRAVENOUS
  Filled 2012-05-29 (×4): qty 1000

## 2012-05-29 MED ORDER — ONDANSETRON HCL 4 MG/2ML IJ SOLN: 4.0000 mg | Freq: Four times a day (QID) | INTRAMUSCULAR | Status: DC | PRN

## 2012-05-29 MED ORDER — 0.9 % SODIUM CHLORIDE (POUR BTL) OPTIME
TOPICAL | Status: DC | PRN
  Administered 2012-05-29: 1000 mL

## 2012-05-29 MED ORDER — HYDROMORPHONE HCL PF 1 MG/ML IJ SOLN
INTRAMUSCULAR | Status: DC | PRN
  Administered 2012-05-29: 1 mg via INTRAVENOUS

## 2012-05-29 MED ORDER — HYDROCODONE-ACETAMINOPHEN 5-325 MG PO TABS: 1.0000 | ORAL_TABLET | ORAL | Status: DC | PRN

## 2012-05-29 MED ORDER — PROMETHAZINE HCL 25 MG/ML IJ SOLN: 6.2500 mg | INTRAMUSCULAR | Status: DC | PRN

## 2012-05-29 MED ORDER — LACTATED RINGERS IV SOLN
INTRAVENOUS | Status: DC | PRN
  Administered 2012-05-29: 21:00:00 via INTRAVENOUS

## 2012-05-29 MED ORDER — KCL IN DEXTROSE-NACL 20-5-0.9 MEQ/L-%-% IV SOLN
INTRAVENOUS | Status: DC
  Filled 2012-05-29 (×4): qty 1000

## 2012-05-29 MED ORDER — VANCOMYCIN HCL IN DEXTROSE 1-5 GM/200ML-% IV SOLN
1000.0000 mg | Freq: Three times a day (TID) | INTRAVENOUS | Status: DC
  Administered 2012-05-29 – 2012-05-31 (×5): 1000 mg via INTRAVENOUS
  Filled 2012-05-29 (×6): qty 200

## 2012-05-29 MED ORDER — MORPHINE SULFATE 2 MG/ML IJ SOLN
2.0000 mg | INTRAMUSCULAR | Status: DC | PRN
  Administered 2012-05-31: 2 mg via INTRAVENOUS
  Filled 2012-05-29: qty 1

## 2012-05-29 MED ORDER — BUPIVACAINE-EPINEPHRINE PF 0.5-1:200000 % IJ SOLN
INTRAMUSCULAR | Status: DC | PRN
  Administered 2012-05-29: 5 mL

## 2012-05-29 MED ORDER — PROPOFOL 10 MG/ML IV BOLUS
INTRAVENOUS | Status: DC | PRN
  Administered 2012-05-29: 150 mg via INTRAVENOUS

## 2012-05-29 MED ORDER — MIDAZOLAM HCL 5 MG/5ML IJ SOLN
INTRAMUSCULAR | Status: DC | PRN
  Administered 2012-05-29: 2 mg via INTRAVENOUS

## 2012-05-29 MED ORDER — SUCCINYLCHOLINE CHLORIDE 20 MG/ML IJ SOLN
INTRAMUSCULAR | Status: DC | PRN
  Administered 2012-05-29: 100 mg via INTRAVENOUS

## 2012-05-29 MED ORDER — ACETAMINOPHEN 10 MG/ML IV SOLN
INTRAVENOUS | Status: DC | PRN
  Administered 2012-05-29: 1000 mg via INTRAVENOUS

## 2012-05-29 SURGICAL SUPPLY — 32 items
BLADE SURG 15 STRL LF DISP TIS (BLADE) ×1 IMPLANT
BLADE SURG 15 STRL SS (BLADE)
BLADE SURG ROTATE 9660 (MISCELLANEOUS) IMPLANT
CANISTER SUCTION 2500CC (MISCELLANEOUS) ×2 IMPLANT
CHLORAPREP W/TINT 26ML (MISCELLANEOUS) IMPLANT
CLOTH BEACON ORANGE TIMEOUT ST (SAFETY) ×2 IMPLANT
COVER SURGICAL LIGHT HANDLE (MISCELLANEOUS) ×2 IMPLANT
DRAPE LAPAROSCOPIC ABDOMINAL (DRAPES) ×2 IMPLANT
ELECT CAUTERY BLADE 6.4 (BLADE) ×2 IMPLANT
ELECT REM PT RETURN 9FT ADLT (ELECTROSURGICAL) ×2
ELECTRODE REM PT RTRN 9FT ADLT (ELECTROSURGICAL) ×1 IMPLANT
GAUZE PACKING IODOFORM 1/4X5 (PACKING) ×1 IMPLANT
GLOVE BIOGEL PI IND STRL 8 (GLOVE) ×1 IMPLANT
GLOVE BIOGEL PI INDICATOR 8 (GLOVE) ×1
GLOVE SURG SS PI 7.5 STRL IVOR (GLOVE) ×2 IMPLANT
GOWN STRL NON-REIN LRG LVL3 (GOWN DISPOSABLE) ×2 IMPLANT
GOWN STRL REIN XL XLG (GOWN DISPOSABLE) ×2 IMPLANT
KIT BASIN OR (CUSTOM PROCEDURE TRAY) ×2 IMPLANT
NS IRRIG 1000ML POUR BTL (IV SOLUTION) ×1 IMPLANT
PACK GENERAL/GYN (CUSTOM PROCEDURE TRAY) ×2 IMPLANT
PENCIL BUTTON HOLSTER BLD 10FT (ELECTRODE) ×1 IMPLANT
SPONGE GAUZE 4X4 12PLY (GAUZE/BANDAGES/DRESSINGS) ×2 IMPLANT
SPONGE LAP 18X18 X RAY DECT (DISPOSABLE) ×1 IMPLANT
SWAB COLLECTION DEVICE MRSA (MISCELLANEOUS) ×1 IMPLANT
SYR BULB IRRIGATION 50ML (SYRINGE) ×1 IMPLANT
SYR CONTROL 10ML LL (SYRINGE) ×1 IMPLANT
TAPE CLOTH SOFT 2X10 (GAUZE/BANDAGES/DRESSINGS) ×1 IMPLANT
TOWEL OR 17X26 10 PK STRL BLUE (TOWEL DISPOSABLE) ×2 IMPLANT
TOWEL OR NON WOVEN STRL DISP B (DISPOSABLE) ×1 IMPLANT
TUBE ANAEROBIC SPECIMEN COL (MISCELLANEOUS) ×1 IMPLANT
WATER STERILE IRR 1000ML POUR (IV SOLUTION) IMPLANT
YANKAUER SUCT BULB TIP NO VENT (SUCTIONS) ×2 IMPLANT

## 2012-05-29 NOTE — Anesthesia Postprocedure Evaluation (Signed)
  Anesthesia Post-op Note  Patient: Susan Zamora  Procedure(s) Performed: Procedure(s) (LRB): IRRIGATION AND DEBRIDEMENT ABSCESS (Right)  Patient Location: PACU  Anesthesia Type: General  Level of Consciousness: awake and alert   Airway and Oxygen Therapy: Patient Spontanous Breathing  Post-op Pain: mild  Post-op Assessment: Post-op Vital signs reviewed, Patient's Cardiovascular Status Stable, Respiratory Function Stable, Patent Airway and No signs of Nausea or vomiting  Post-op Vital Signs: stable  Complications: No apparent anesthesia complications

## 2012-05-29 NOTE — Transfer of Care (Signed)
Immediate Anesthesia Transfer of Care Note  Patient: Susan Zamora  Procedure(s) Performed: Procedure(s) (LRB) with comments: IRRIGATION AND DEBRIDEMENT ABSCESS (Right)  Patient Location: PACU  Anesthesia Type:General  Level of Consciousness: awake, sedated, patient cooperative and responds to stimulation  Airway & Oxygen Therapy: Patient Spontanous Breathing and Patient connected to face mask oxygen  Post-op Assessment: Report given to PACU RN, Post -op Vital signs reviewed and stable and Patient moving all extremities  Post vital signs: Reviewed and stable  Complications: No apparent anesthesia complications

## 2012-05-29 NOTE — Progress Notes (Signed)
Pt transported to OR by OR tech. IVABT and IV fluid taken down with Pt to be administered.

## 2012-05-29 NOTE — Preoperative (Signed)
Beta Blockers   Reason not to administer Beta Blockers:Not Applicable, not on home BB 

## 2012-05-29 NOTE — Progress Notes (Signed)
31 YO seen in MAU 05/27/12 with right breast mastitis, on Keflex 500 mg/qid and is breastfeeding.  Had begun Keflex on 05/26/12 but fever had begun to rise so went to MAU.  Has been taking Tylenol and Ibuprofen.  At MAU visit there was a raised, red "dome" per patient that this morning ruptured yielding blood and pus. Has continued to breast feed though latching is difficult.  O:  Right Breast: Marked edema with draining abscess at the upper/inner quadrant above areola, drainage is purulent and surrounding breast is exquisitely        tender, very warm and indurated. Right axilla is tender with adenopathy.  Dr. Stefano Gaul in to evaluate and recommends further evaluation by a general surgeon.       Left Breast without masses, tenderness, skin changes, dimpling or adenopathy        Aerobic & Anaerobic wound cultures taken-pending  A: Right Breast Abscess-draining     Right Breast Mastitis     S/P C-section 05/27/12 (Dr. Stefano Gaul)  P: Call to on-call physician-Dr. Pennie Rushing who arranged for patient to be seen by      general surgeon-Dr. Corliss Skains at the [facility]       RTO-as scheduled or prn  Susan Hilyer, PA-C

## 2012-05-29 NOTE — Telephone Encounter (Signed)
Returned pt's call. LM to return call amd speak with someone in triage.

## 2012-05-29 NOTE — Progress Notes (Signed)
Date of delivery: 04/26/12  AVS Female Name: rafe Vaginal delivery:no Cesarean section:yes Tubal ligation:no GDM:no Breast Feeding:yes Bottle Feeding:no Post-Partum Blues:no Abnormal pap:yes Normal GU function: yes Normal GI function:yes Returning to work:no

## 2012-05-29 NOTE — Progress Notes (Signed)
Patient ID: Susan Zamora, female   DOB: 1981-03-31, 31 y.o.   MRN: [id number]  Chief Complaint  Patient presents with  . Other    urge office Rhys Martini Rt br abs    HPI Susan Zamora is a 31 y.o. female.  Referred by Dr. Dierdre Forth for evaluation of right breast abscess HPI This is a 31 year old female who is 4 weeks postpartum who presents with a two-week history of progressing erythema of the right breast. Right breast has become very swollen. She was started on oral antibiotics (Keflex) last week as well as Tylenol and Motrin. She's been afebrile over the last few days. She was evaluated in the [facility] ED over the weekend. This morning, it appears that the abscess has ruptured and is now draining through the skin. She has been breast-feeding, even on the right side. This has been exquisitely tender. She presents to urgent office for evaluation.  Past Medical History  Diagnosis Date  . Abnormal Pap smear 06/2011    ASCUS  . H/O varicella   . Hyperemesis arising during pregnancy 2013    Past Surgical History  Procedure Date  . Wisdom tooth extraction 1996    age 30  . Tonsillectomy 1985    age 33  . Cesarean section 04/26/2012    Procedure: CESAREAN SECTION;  Surgeon: Kirkland Hun, MD;  Location: [facility];  Service: Obstetrics;  Laterality: N/A;  Primary Cesarean Section Delivey Baby Boy @ 1643,    Family History  Problem Relation Age of Onset  . Mental illness Mother   . Alcohol abuse Father   . Kidney disease Sister   . Alcohol abuse Sister   . Alcohol abuse Brother     Social History History  Substance Use Topics  . Smoking status: Never Smoker   . Smokeless tobacco: Never Used  . Alcohol Use: Yes     3 drinks per week    Allergies  Allergen Reactions  . Codeine Nausea Only    Current Outpatient Prescriptions  Medication Sig Dispense Refill  . acetaminophen (TYLENOL) 650 MG CR tablet Take 650 mg by mouth every 8 (eight) hours as needed.  pain      . cephALEXin (KEFLEX) 500 MG capsule Take 500 mg by mouth 4 (four) times daily. Started 05/27/12 x 10 days      . ibuprofen (ADVIL,MOTRIN) 600 MG tablet Take 1 tablet (600 mg total) by mouth every 6 (six) hours as needed for pain.  36 tablet  2  . Prenatal Vit-Fe Fumarate-FA (PRENATAL MULTIVITAMIN) TABS Take 1 tablet by mouth daily.        Review of Systems Review of Systems  Constitutional: Positive for fever and fatigue. Negative for chills and unexpected weight change.  HENT: Negative for hearing loss, congestion, sore throat, trouble swallowing and voice change.   Eyes: Negative for visual disturbance.  Respiratory: Negative for cough and wheezing.   Cardiovascular: Negative for chest pain, palpitations and leg swelling.  Gastrointestinal: Negative for nausea, vomiting, abdominal pain, diarrhea, constipation, blood in stool, abdominal distention and anal bleeding.  Genitourinary: Negative for hematuria, vaginal bleeding and difficulty urinating.  Musculoskeletal: Negative for arthralgias.  Skin: Negative for rash.  Neurological: Negative for seizures, syncope and headaches.  Hematological: Negative for adenopathy. Does not bruise/bleed easily.  Psychiatric/Behavioral: Negative for confusion.    Blood pressure 120/90, pulse 93, temperature 97.3 F (36.3 C), temperature source Temporal, height 5\' 3"  (1.6 m), weight 142 lb 3.2 oz (64.501  kg), SpO2 98.00%.  Physical Exam Physical Exam WDWN in NAD HEENT:  EOMI, sclera anicteric Neck:  No masses, no thyromegaly Lungs:  CTA bilaterally; normal respiratory effort CV:  Regular rate and rhythm; no murmurs Abd:  +bowel sounds, soft, non-tender, no masses Ext:  Well-perfused; no edema Right breast - almost entirely erythematous - extending from axilla to sternum; mid-portion of breast shows induration with exquisite tenderness; at 2-3 o'clock, there are small skin openings with some necrotic skin and purulent drainage.  The  abscess is estimated at 6-7 cm across  Data Reviewed none  Assessment    Right breast abscess 6-7 cm across with surrounding cellulitis    Plan    Direct admit for emergent drainage of breast abscess tonight by the [facility] on-call surgeon.  The surgical procedure has been discussed with the patient.  Potential risks, benefits, alternative treatments, and expected outcomes have been explained.  All of the patient's questions at this time have been answered.  The likelihood of reaching the patient's treatment goal is good.  The patient understand the proposed surgical procedure and wishes to proceed.        Haasini Patnaude K. 05/29/2012, 5:12 PM

## 2012-05-29 NOTE — Telephone Encounter (Signed)
TC from pt.  States saw [facility] 05/27/12 for mastitis.  During the night are ruptured and has nickel size opening with discharge.  Had fever 05/28/12 and is taking Tylenol and Ibuprofen around the clock. Sched with EP today.

## 2012-05-29 NOTE — Op Note (Signed)
Preoperative Diagnosis:  right breast abscess  Postoprative Diagnosis:  right breast abscess  Procedure: Procedure(s): INCISION AND DRAINAGE RIGHT BREAST ABSCESS   Surgeon: Glenna Fellows T   Assistants: None  Anesthesia:  General endotracheal anesthesia  Indications:   Patient is a 31 year old female several weeks postpartum, and nursing, who developed cellulitis of the right breast about one week ago. She has been on oral antibiotics but has developed worsening cellulitis and then some skin breakdown and purulent drainage in the superior breast just off the areola.  We have recommended proceeding with incision and drainage under general anesthesia. The indications in nature the procedure have been discussed and she is in agreement  Procedure Detail:  Patient is brought to the operating room, placed in the supine position on the operating table endotracheal anesthesia induced. The right breast was widely sterilely prepped and draped. She had received broad-spectrum IV antibiotics. There was diffuse cellulitis of the breast. There were 2 small draining openings in the skin at about the 1:00 position about 1 cm away from the areolar edge with thinning and early necrosis of the skin surrounding these 2 openings. The obviously necrotic skin was sharply excised creating about a 1-1/2 cm opening and there was purulence and necrotic subcutaneous tissue beneath this. Cultures were obtained. The cavity was explored and tracked laterally but stayed fairly superficial in the subcutaneous tissue or superficial breast tissue. There was also some slight tracking medially. However there was no large collection of pus or large necrotic area. All loculations were carefully broken up with blunt dissection until I was sure there were no further undrained areas. The cavity was then thoroughly irrigated with saline. The soft tissue was infiltrated with Marcaine. The wound and cavity were packed with  quarter-inch iodoform gauze. Dry sterile dressing was applied. Sponge needle and ischemic counts were correct.   Estimated Blood Loss:  Minimal         Drains: wound packed with iodoform gauze  Blood Given: none          Specimens: culture and sensitivity        Complications:  * No complications entered in OR log *         Disposition: PACU - hemodynamically stable.         Condition: stable

## 2012-05-29 NOTE — Progress Notes (Addendum)
ANTIBIOTIC CONSULT NOTE - INITIAL  Pharmacy Consult for Vancomycin Indication: cellulitis  Allergies  Allergen Reactions  . Codeine Nausea Only    Patient Measurements:   Weight: 64.5 kg   Vital Signs: Temp: 98.2 F (36.8 C) (10/29 1755) Temp src: Oral (10/29 1755) BP: 127/85 mmHg (10/29 1755) Pulse Rate: 96  (10/29 1755)  Labs: No results found for this basename: WBC:3,HGB:3,PLT:3,LABCREA:3,CREATININE:3 in the last 72 hours The CrCl is unknown because both a height and weight (above a minimum accepted value) are required for this calculation. No results found for this basename: VANCOTROUGH:2,VANCOPEAK:2,VANCORANDOM:2,GENTTROUGH:2,GENTPEAK:2,GENTRANDOM:2,TOBRATROUGH:2,TOBRAPEAK:2,TOBRARND:2,AMIKACINPEAK:2,AMIKACINTROU:2,AMIKACIN:2, in the last 72 hours   Microbiology: No results found for this or any previous visit (from the past 720 hour(s)).  Medical History: Past Medical History  Diagnosis Date  . Abnormal Pap smear 06/2011    ASCUS  . H/O varicella   . Hyperemesis arising during pregnancy 2013    Assessment:  19 YOF presents 4 weeks postpartum with right breast abscess/cellulitis.  Patient was started on Keflex as outpatient treatment, but now presents with abscess rupture.  To begin vancomycin and zosyn for empiric treatment.  Emergent drainage of abscess tonight.  No SCr available to assess renal clearance.  Goal of Therapy:  Vancomycin trough level 15-20 mcg/ml  Plan:  Vancomycin 1g IV x 1 now.   F/u STAT SCr to determine subsequent doses.  Clance Boll 05/29/2012,6:49 PM  Addendum: 05/29/2012 8:56 PM SCr 0.80, CrCl>100 ml/min (normalized) Plan: Vancomycin 1g IV q8h.  F/u SCr and trough.  Clance Boll, PharmD, BCPS Pager: [phone] 05/29/2012 8:58 PM

## 2012-05-29 NOTE — Anesthesia Preprocedure Evaluation (Addendum)
Anesthesia Evaluation  Patient identified by MRN, date of birth, ID band Patient awake    Reviewed: Allergy & Precautions, H&P , NPO status , Patient's Chart, lab work & pertinent test results  Airway Mallampati: II TM Distance: >3 FB Neck ROM: Full    Dental No notable dental hx.    Pulmonary neg pulmonary ROS,  breath sounds clear to auscultation  Pulmonary exam normal       Cardiovascular hypertension, Pt. on medications Rhythm:Regular Rate:Normal     Neuro/Psych negative neurological ROS  negative psych ROS   GI/Hepatic negative GI ROS, Neg liver ROS,   Endo/Other  negative endocrine ROS  Renal/GU negative Renal ROS  negative genitourinary   Musculoskeletal negative musculoskeletal ROS (+)   Abdominal   Peds negative pediatric ROS (+)  Hematology negative hematology ROS (+)   Anesthesia Other Findings   Reproductive/Obstetrics negative OB ROS                          Anesthesia Physical Anesthesia Plan  ASA: II  Anesthesia Plan: General   Post-op Pain Management:    Induction: Intravenous and Rapid sequence  Airway Management Planned: Oral ETT  Additional Equipment:   Intra-op Plan:   Post-operative Plan: Extubation in OR  Informed Consent: I have reviewed the patients History and Physical, chart, labs and discussed the procedure including the risks, benefits and alternatives for the proposed anesthesia with the patient or authorized representative who has indicated his/her understanding and acceptance.   Dental advisory given  Plan Discussed with: CRNA and Surgeon  Anesthesia Plan Comments:         Anesthesia Quick Evaluation

## 2012-05-30 ENCOUNTER — Encounter (HOSPITAL_COMMUNITY): Payer: Self-pay | Admitting: General Surgery

## 2012-05-30 ENCOUNTER — Telehealth: Payer: Self-pay | Admitting: Obstetrics and Gynecology

## 2012-05-30 LAB — URINALYSIS, DIPSTICK ONLY
Bilirubin Urine: NEGATIVE
Nitrite: NEGATIVE
Specific Gravity, Urine: 1.026 (ref 1.005–1.030)
Urobilinogen, UA: 1 mg/dL (ref 0.0–1.0)
pH: 5.5 (ref 5.0–8.0)

## 2012-05-30 MED ORDER — CHLORHEXIDINE GLUCONATE CLOTH 2 % EX PADS
6.0000 | MEDICATED_PAD | Freq: Every day | CUTANEOUS | Status: DC
  Administered 2012-05-30: 6 via TOPICAL

## 2012-05-30 MED ORDER — TRAMADOL HCL 50 MG PO TABS
50.0000 mg | ORAL_TABLET | ORAL | Status: DC | PRN
  Administered 2012-05-30 – 2012-05-31 (×4): 50 mg via ORAL
  Filled 2012-05-30 (×4): qty 1

## 2012-05-30 MED ORDER — IBUPROFEN 800 MG PO TABS: 400.0000 mg | ORAL_TABLET | Freq: Four times a day (QID) | ORAL | Status: DC | PRN

## 2012-05-30 MED ORDER — MUPIROCIN 2 % EX OINT
1.0000 "application " | TOPICAL_OINTMENT | Freq: Two times a day (BID) | CUTANEOUS | Status: DC
  Administered 2012-05-30 – 2012-05-31 (×3): 1 via NASAL
  Filled 2012-05-30: qty 22

## 2012-05-30 MED ORDER — ENOXAPARIN SODIUM 40 MG/0.4ML ~~LOC~~ SOLN
40.0000 mg | SUBCUTANEOUS | Status: DC
  Administered 2012-05-30 – 2012-05-31 (×2): 40 mg via SUBCUTANEOUS
  Filled 2012-05-30 (×2): qty 0.4

## 2012-05-30 MED ORDER — PANTOPRAZOLE SODIUM 40 MG PO TBEC
40.0000 mg | DELAYED_RELEASE_TABLET | Freq: Every day | ORAL | Status: DC
  Administered 2012-05-30: 40 mg via ORAL
  Filled 2012-05-30: qty 1

## 2012-05-30 NOTE — Telephone Encounter (Signed)
TC from pt.  Had I&D of breast due to mastitis 05/29/12.  Is currently hospitalized at [facility].  Is questioning if OK to breast feed.  States has been told by staff OK after anesthesia is worn off and between doses of antibiotic but not during administration of ATB. Was advised to contact our office for confirmation this is OK.  Consult with MK. LM for DR AVS.

## 2012-05-30 NOTE — Progress Notes (Signed)
Called pharmacy about antibiotics patient is currently on and breastfeeding; pharmacy informed that antibiotics could disrupt infant GI flora and cause GI upset; pt informed, suggested to call midwife; pt and family stated understanding

## 2012-05-30 NOTE — Progress Notes (Signed)
Susan Rusk, NP spoke with patient about pumping and antibiotics; informed patient that she can express milk and give to baby in between antibiotic therapy, but needs to pump and throw away when receiving them; pt and family stated understanding

## 2012-05-30 NOTE — Care Management Note (Signed)
    Page 1 of 1   05/31/2012     12:05:47 PM   CARE MANAGEMENT NOTE 05/31/2012  Patient:  Susan Zamora, Susan Zamora   Account Number:  [id number]  Date Initiated:  05/30/2012  Documentation initiated by:  Lorenda Ishihara  Subjective/Objective Assessment:   31 yo female admitted for I&D of right breast, patient s/p c section 9/26. PTA lived at home with spouse.     Action/Plan:   home when stable   Anticipated DC Date:  05/31/2012   Anticipated DC Plan:  HOME/SELF CARE      DC Planning Services  CM consult      Choice offered to / List presented to:             Status of service:  Completed, signed off Medicare Important Message given?   (If response is "NO", the following Medicare IM given date fields will be blank) Date Medicare IM given:   Date Additional Medicare IM given:    Discharge Disposition:  HOME/SELF CARE  Per UR Regulation:  Reviewed for med. necessity/level of care/duration of stay  If discussed at Long Length of Stay Meetings, dates discussed:    Comments:

## 2012-05-30 NOTE — Progress Notes (Signed)
Patient ID: Susan Zamora, female   DOB: Dec 18, 1980, 31 y.o.   MRN: [id number] 1 Day Post-Op  Subjective: Patient feels better this morning with less pain and tenderness since surgery.  Objective: Vital signs in last 24 hours: Temp:  [97.3 F (36.3 C)-98.6 F (37 C)] 98.6 F (37 C) (10/30 0605) Pulse Rate:  [78-100] 78  (10/30 0605) Resp:  [16-18] 16  (10/30 0605) BP: (100-127)/(61-90) 111/73 mmHg (10/30 0605) SpO2:  [96 %-100 %] 99 % (10/30 0605) Weight:  [142 lb 3.2 oz (64.501 kg)-145 lb (65.772 kg)] 142 lb 3.2 oz (64.501 kg) (10/29 1755)    Intake/Output from previous day: 10/29 0701 - 10/30 0700 In: 1450 [P.O.:120; I.V.:1030; IV Piggyback:300] Out: 450 [Urine:450] Intake/Output this shift:    General appearance: alert and no distress Breasts: normal appearance, no masses or tenderness, there is less erythema over the breast. Dressing is intact with minimal drainage.  Lab Results:   [facility] 05/29/12 1927  WBC 10.9*  HGB 10.4*  HCT 31.9*  PLT 302   BMET  Basename 05/29/12 1927  NA --  K --  CL --  CO2 --  GLUCOSE --  BUN --  CREATININE 0.80  CALCIUM --     Studies/Results: No results found.  Anti-infectives: Anti-infectives     Start     Dose/Rate Route Frequency Ordered Stop   05/30/12 0600   vancomycin (VANCOCIN) IVPB 1000 mg/200 mL premix        1,000 mg 200 mL/hr over 60 Minutes Intravenous Every 8 hours 05/29/12 2059     05/29/12 2000  piperacillin-tazobactam (ZOSYN) IVPB 3.375 g       3.375 g 12.5 mL/hr over 240 Minutes Intravenous Every 8 hours 05/29/12 1845     05/29/12 1930   vancomycin (VANCOCIN) IVPB 1000 mg/200 mL premix        1,000 mg 200 mL/hr over 60 Minutes Intravenous NOW 05/29/12 1901 05/30/12 0008          Assessment/Plan: s/p Procedure(s): Incision and drainage of right breast abscess. Clinically this is suspicious for staph infection. Nasal swab was positive. She is on isolation. Continue current antibiotics for  now. Await cultures Plan to remove packing tomorrow and home when significantly clinically improved.    LOS: 1 day    Lauria Depoy T 05/30/2012

## 2012-05-30 NOTE — Telephone Encounter (Signed)
TC from DR AVS. Reviewed medications pt is taking per EMR chart.  Per Dr AVS, OK for pt to breast feed.  To watch for signs of diarrhea in baby.Pt verbalizes comprehension.

## 2012-05-31 ENCOUNTER — Telehealth (INDEPENDENT_AMBULATORY_CARE_PROVIDER_SITE_OTHER): Payer: Self-pay | Admitting: General Surgery

## 2012-05-31 ENCOUNTER — Telehealth (INDEPENDENT_AMBULATORY_CARE_PROVIDER_SITE_OTHER): Payer: Self-pay

## 2012-05-31 ENCOUNTER — Other Ambulatory Visit (INDEPENDENT_AMBULATORY_CARE_PROVIDER_SITE_OTHER): Payer: Self-pay | Admitting: General Surgery

## 2012-05-31 LAB — BASIC METABOLIC PANEL
BUN: 8 mg/dL (ref 6–23)
Calcium: 8.9 mg/dL (ref 8.4–10.5)
GFR calc non Af Amer: 77 mL/min — ABNORMAL LOW (ref >90)
Glucose, Bld: 107 mg/dL — ABNORMAL HIGH (ref 70–99)

## 2012-05-31 MED ORDER — TRAMADOL HCL 50 MG PO TABS: 50.0000 mg | ORAL_TABLET | ORAL | Status: DC | PRN

## 2012-05-31 MED ORDER — VANCOMYCIN HCL IN DEXTROSE 1-5 GM/200ML-% IV SOLN: 1000.0000 mg | Freq: Two times a day (BID) | INTRAVENOUS | Status: DC

## 2012-05-31 NOTE — Telephone Encounter (Signed)
She called while I was in the operating room. The circulating nurse took the message. She had a breast abscess drained 2 days ago. She took some tramadol this afternoon and had some problems with nausea and vomiting. I advised him not to taking him or the pain medicine. Phenergan was called in for her nausea and vomiting. She was advised to only take ice chips and sips of water. Once her nausea improved she could then start a clear liquid diet and take her antibiotics.

## 2012-05-31 NOTE — Progress Notes (Signed)
ANTIBIOTIC CONSULT NOTE - Follow Up  Pharmacy Consult for Vancomycin Indication: cellulitis  Allergies  Allergen Reactions  . Codeine Nausea Only    Patient Measurements: Height: 5\' 3"  (160 cm) Weight: 142 lb 3.2 oz (64.501 kg) IBW/kg (Calculated) : 52.4  Weight: 64.5 kg   Vital Signs: Temp: 97.7 F (36.5 C) (10/31 0557) Temp src: Oral (10/31 0557) BP: 122/87 mmHg (10/31 0557) Pulse Rate: 87  (10/31 0557)  Labs:  Basename 05/31/12 0424 05/29/12 1927  WBC -- 10.9*  HGB -- 10.4*  PLT -- 302  LABCREA -- --  CREATININE 0.96 0.80   Estimated Creatinine Clearance: 75.3 ml/min (by C-G formula based on Cr of 0.96).  Basename 05/31/12 0424  VANCOTROUGH 24.7*  VANCOPEAK --  Drue Dun --  GENTTROUGH --  GENTPEAK --  GENTRANDOM --  TOBRATROUGH --  TOBRAPEAK --  TOBRARND --  AMIKACINPEAK --  AMIKACINTROU --  AMIKACIN --     Microbiology: Recent Results (from the past 720 hour(s))  WOUND CULTURE     Status: Normal (Preliminary result)   Collection Time   05/29/12  3:35 PM      Component Value Range Status Comment   GRAM STAIN No WBC Seen   Preliminary    GRAM STAIN No Squamous Epithelial Cells Seen   Preliminary    GRAM STAIN No Organisms Seen   Preliminary   SURGICAL PCR SCREEN     Status: Abnormal   Collection Time   05/29/12  7:17 PM      Component Value Range Status Comment   MRSA, PCR POSITIVE (*) NEGATIVE Final    Staphylococcus aureus POSITIVE (*) NEGATIVE Final   ANAEROBIC CULTURE     Status: Normal (Preliminary result)   Collection Time   05/29/12  9:32 PM      Component Value Range Status Comment   Specimen Description BREAST   Final    Special Requests ABSCESS   Final    Gram Stain     Final    Value: NO WBC SEEN     NO SQUAMOUS EPITHELIAL CELLS SEEN     FEW GRAM POSITIVE COCCI IN PAIRS   Culture     Final    Value: NO ANAEROBES ISOLATED; CULTURE IN PROGRESS FOR 5 DAYS   Report Status PENDING   Incomplete   CULTURE, ROUTINE-ABSCESS      Status: Normal (Preliminary result)   Collection Time   05/29/12  9:32 PM      Component Value Range Status Comment   Specimen Description BREAST   Final    Special Requests ABSCESS   Final    Gram Stain     Final    Value: MODERATE WBC PRESENT,BOTH PMN AND MONONUCLEAR     NO SQUAMOUS EPITHELIAL CELLS SEEN     FEW GRAM POSITIVE COCCI IN CLUSTERS     IN PAIRS   Culture NO GROWTH   Final    Report Status PENDING   Incomplete     Medical History: Past Medical History  Diagnosis Date  . Abnormal Pap smear 06/2011    ASCUS  . H/O varicella   . Hyperemesis arising during pregnancy 2013    Assessment:  38 YOF presents 4 weeks postpartum with right breast abscess/cellulitis.  Patient was started on Keflex as outpatient treatment, but now presents with abscess rupture.  Emergent drainage of abscess done 10/29  Day 3 Vancomycin/Zosyn  Stable SCr (slight increase)  Vanc Trough supratherapeutic  Goal of Therapy:  Vancomycin trough  level 15-20 mcg/ml  Plan:  1) Change vancomycin from 1g IV q8 to 1g IV q12 2) Monitor SCr and will recheck a trough as necessary 3) Continue current Zosyn dosing 4) Follow up final culture results   Hessie Knows, PharmD, BCPS Pager [phone] 05/31/2012 7:09 AM

## 2012-05-31 NOTE — Progress Notes (Signed)
2 Days Post-Op  Subjective: Generally doing well this morning, was able to pump last night.  Still very tender right breast.  Objective: Vital signs in last 24 hours: Temp:  [97.7 F (36.5 C)-98.6 F (37 C)] 97.7 F (36.5 C) (10/31 0557) Pulse Rate:  [71-108] 87  (10/31 0557) Resp:  [16-18] 18  (10/31 0557) BP: (116-126)/(80-87) 122/87 mmHg (10/31 0557) SpO2:  [95 %-99 %] 97 % (10/31 0557)    Intake/Output from previous day: 10/30 0701 - 10/31 0700 In: 2226 [P.O.:360; I.V.:1641; IV Piggyback:225] Out: 2350 [Urine:2350] Intake/Output this shift:    General appearance: alert, cooperative, appears stated age and no distress Breasts: normal appearance, no masses or tenderness, there is less erythema over the breast. Dressing C/D/I wound packing removed this morning w/o difficulty.  Wound appears well perfused, tissue is beefy red,no drng.  Labs: WBC wnl Culture report: BREAST Special Requests ABSCESS Gram Stain NO WBC SEEN NO SQUAMOUS EPITHELIAL CELLS SEEN FEW GRAM POSITIVE COCCI IN PAIRS Culture NO ANAEROBES ISOLATED; CULTURE IN PROGRESS FOR 5 DAYS VSS, afebrile  Lab Results:   [facility] 05/29/12 1927  WBC 10.9*  HGB 10.4*  HCT 31.9*  PLT 302   BMET  Basename 05/31/12 0424 05/29/12 1927  NA 140 --  K 3.4* --  CL 105 --  CO2 24 --  GLUCOSE 107* --  BUN 8 --  CREATININE 0.96 0.80  CALCIUM 8.9 --   PT/INR No results found for this basename: LABPROT:2,INR:2 in the last 72 hours ABG No results found for this basename: PHART:2,PCO2:2,PO2:2,HCO3:2 in the last 72 hours  Studies/Results: No results found.  Anti-infectives: Anti-infectives     Start     Dose/Rate Route Frequency Ordered Stop   05/31/12 1800   vancomycin (VANCOCIN) IVPB 1000 mg/200 mL premix        1,000 mg 200 mL/hr over 60 Minutes Intravenous Every 12 hours 05/31/12 0711     05/30/12 0600   vancomycin (VANCOCIN) IVPB 1000 mg/200 mL premix  Status:  Discontinued        1,000 mg 200 mL/hr over  60 Minutes Intravenous Every 8 hours 05/29/12 2059 05/31/12 0711   05/29/12 2000  piperacillin-tazobactam (ZOSYN) IVPB 3.375 g       3.375 g 12.5 mL/hr over 240 Minutes Intravenous Every 8 hours 05/29/12 1845     05/29/12 1930   vancomycin (VANCOCIN) IVPB 1000 mg/200 mL premix        1,000 mg 200 mL/hr over 60 Minutes Intravenous NOW 05/29/12 1901 05/30/12 0008          Assessment/Plan: s/p Procedure(s) (LRB) with comments: IRRIGATION AND DEBRIDEMENT ABSCESS (Right) 1. Wound care dressing change BID and prn 2. Patient may shower tomorrow 3. Will complete this afternoon's dose of ABX. 4. Probably discharge later today.     LOS: 2 days    Susan Zamora, Susan Zamora 05/31/2012 Still with some mild cellulitis but feels better.  Wound clean.  Should be okay for discharge home on augmentin.  If cellulitis persists or pain increases, then she will return.

## 2012-05-31 NOTE — Discharge Summary (Signed)
Physician Discharge Summary  Patient ID: Susan Zamora MRN: [id number] DOB/AGE: [birth year] 33 y.o.  Admit date: 05/29/2012 Discharge date: 05/31/2012  Admission Diagnoses: Right breast abscess  Discharge Diagnoses: S/P right Breast abscess I&D Active Problems:  * No active hospital problems. *    Discharged Condition: stable  Hospital Course:This is a 31 year old female who is 4 weeks postpartum who presented with a two-week history of progressing erythema of the right breast. Right breast had become very swollen. She was started on oral antibiotics (Keflex)theprevious week well as Tylenol and Motrin. She's has been afebrile over the last few days. She was evaluated in the [facility] ED over the previous weekend. On initial exam, it appears that the abscess has ruptured and is now draining through the skin. She has been breast-feeding, even on the right side. This has been exquisitely tender. She presents to urgent office for evaluation.  Based on clinical examination and failure of outpatient antibiotic therapy, she was directly admitted for emergent drainage of breast abscess. The patient underwent this procedure by Dr. Johna Sheriff on 10/29/13at [facility].  Post op was treated with IVF, ABX, pain meds ; ( Breast culture was positive for few gram pos cocci in pairs only; no anaerobes or WBC's) she has remained hemodynamically stable and afebrile. She has been able to continue with breast feeding; We have had her dump her breast milk when she is actively receiving IV ABX as per Pharmacy's recommendations. During the times where she is not actively receiving IV ABX it has been within the current guidelines that it was safe for her to utilize that breast milk and she has done so with out any detrimental effects.   The patient had the wound packing removed today and the wound site appears beefy red and well perfused w/o any purulent drainage. She has been rx'd oral Augmentin (875/125 po BID) for  the next 10 days and this too has been cleared via pharmacy as safe for continued breast feeding w/o recommendation of dumping the breast milk while she is on abx therapy. The patient has been educated as to this and she has verbalized understanding of these instructions.  She will follow up with Dr. Johna Sheriff in our office in 1-2 weeks time or sooner if the need arises. She has been given after care instructions and our phone numbers for her use.     Consults: None  Significant Diagnostic Studies: labs, microbiology.   Treatments: IV hydration, antibiotics, analgesia, respiratory therapy and surgery.  Discharge Exam: Blood pressure 122/87, pulse 87, temperature 97.7 F (36.5 C), temperature source Oral, resp. rate 18, height 5\' 3"  (1.6 m), weight 142 lb 3.2 oz (64.501 kg), SpO2 97.00%.  General appearance: alert, cooperative, appears stated age and no distress  Breasts: normal appearance, no masses or tenderness, there is less erythema over the breast. Dressing C/D/I wound packing removed this morning w/o difficulty. Wound appears well perfused, tissue is beefy red,no drng  Disposition: 01-Home or Self Care  Discharge Orders    Future Appointments: Provider: Department: Dept Phone: Center:   06/07/2012 10:45 AM Nigel Bridgeman, CNM [facility] [phone] None     Future Orders Please Complete By Expires   Discharge patient      Comments:   To home self care with follow up in 2 weeks time with Dr. Johna Sheriff       Medication List     As of 05/31/2012  1:11 PM    STOP taking these medications  cephALEXin 500 MG capsule   Commonly known as: KEFLEX      ibuprofen 600 MG tablet   Commonly known as: ADVIL,MOTRIN      TAKE these medications         acetaminophen 650 MG CR tablet   Commonly known as: TYLENOL   Take 650 mg by mouth every 8 (eight) hours as needed. pain      prenatal multivitamin Tabs   Take 1 tablet by mouth daily.      traMADol 50 MG tablet   Commonly  known as: ULTRAM   Take 1 tablet (50 mg total) by mouth every 4 (four) hours as needed.           Follow-up Information    Follow up with HOXWORTH,BENJAMIN T, MD. In 2 weeks. (call our office as needed,or if symptoms worsen)    Contact information:   [address] [place] [address] [phone]          Signed: Blenda Mounts ACNP [facility] 05/31/2012, 1:11 PM

## 2012-05-31 NOTE — Telephone Encounter (Signed)
Pt called stating she has done well getting home but did vomit once this afternoon. No other vomiting. Pt states she has not yet started her antibiotic. Is suppose to start it at 8:00 pm tonight. Pt will stay on clear liquids for 24 hrs except dry toast with antibiotic and call if vomiting recurs. Pt will monitor temps also.

## 2012-06-01 LAB — CULTURE, ROUTINE-ABSCESS

## 2012-06-01 LAB — WOUND CULTURE: Gram Stain: NONE SEEN

## 2012-06-04 ENCOUNTER — Telehealth (INDEPENDENT_AMBULATORY_CARE_PROVIDER_SITE_OTHER): Payer: Self-pay

## 2012-06-04 ENCOUNTER — Telehealth (INDEPENDENT_AMBULATORY_CARE_PROVIDER_SITE_OTHER): Payer: Self-pay | Admitting: General Surgery

## 2012-06-04 NOTE — Telephone Encounter (Signed)
Message copied by Maryan Puls on Mon Jun 04, 2012  1:48 PM ------      Message from: Zacarias Pontes      Created: Mon Jun 04, 2012 10:15 AM       Pt has sx on 10/31 and needs a 2 week po apt....[phone] thanks

## 2012-06-04 NOTE — Telephone Encounter (Signed)
Pt called to state she is having difficulty taking the Augmentin, even with food, it causes nausea.  The abscess is much improved, healing and less painful now.  Paged Dr. Johna Sheriff for orders; will stop antibiotics for now.  Pt advised and agrees to call back if new symptoms arise.

## 2012-06-04 NOTE — Telephone Encounter (Signed)
Post op appointment given to patient for 06/18/12 @ 2:45 pm w/Dr. Johna Sheriff.  Patient advised to arrive at 2:30 for registration.

## 2012-06-07 ENCOUNTER — Encounter: Payer: Self-pay | Admitting: Obstetrics and Gynecology

## 2012-06-07 ENCOUNTER — Ambulatory Visit (INDEPENDENT_AMBULATORY_CARE_PROVIDER_SITE_OTHER): Payer: BC Managed Care – PPO | Admitting: Obstetrics and Gynecology

## 2012-06-07 NOTE — Progress Notes (Signed)
Date of delivery: 04/26/2012 Female Name: Susan Zamora  Vaginal delivery:no Cesarean section:yes Tubal ligation:no GDM:no Breast Feeding:yes Bottle Feeding:yes Post-Partum Blues:no Abnormal pap:yes Normal GU function: yes Normal GI function:yes Returning to work:no EPDS: 3

## 2012-06-07 NOTE — Progress Notes (Signed)
Susan Zamora is a 31 y.o. female who presents for a postpartum visit.   Type of delivery:  Primary LTCS for FTD after 4 hours of pushing, with AVS.  Also had pre-eclampsia, home on Procardia until approx 2 weeks ago.  PP experience remarkable for right breast abscess, with IV ATB in hospital x 2 days, then on po x 4 doses, but was intolerant of that due to N/V. Area is being dressed every day after her shower.  Patient reports doing well, with abscess resolving.  Feels she is doing well now.  Hx remarkable for: Patient Active Problem List  Diagnosis  . Abnormal Pap smear  . Normal pregnancy, first  . Poor weight gain of pregnancy  . Elevated blood pressure  . Status post primary low transverse cesarean section--FTD  . Pre-eclampsia  . Abscess of right breast      PPDS = 3, denies SI/HI  Contraception plan:  Mirena   I have fully reviewed the prenatal and intrapartum course   Patient has not been sexually active since delivery.   The following portions of the patient's history were reviewed and updated as appropriate: allergies, current medications, past family history, past medical history, past social history, past surgical history and problem list.  Review of Systems Pertinent items are noted in HPI.   Objective:    BP 104/82  Wt 144 lb (65.318 kg)  Breastfeeding? Yes  General:  alert, cooperative and no distress     Lungs: clear to auscultation bilaterally  Heart:  regular rate and rhythm, S1, S2 normal, no murmur  Abdomen: soft, non-tender; bowel sounds normal; no masses,  no organomegaly.   Incision:  Well-healed LTCS   Vulva:  normal  Vagina: normal vagina  Cervix:  normal  Uterus: normal size, contour, position, consistency, mobility, non-tender, well-involuted  Adnexa:  normal adnexa  Breast Right breast has dressing over abscess site.  Open area is approx 1-2 cm now, with granulation tissue per patient description--area healing by 2ndary  intention.  NT, no erythema of breast evident.          Assessment:     Normal postpartum exam.  Resolved pre-eclampsia Resolving right breast abscess. Wants Mirena--declines cultures Pap smear:   Not done at today's visit.   Due 11/2012  Plan:  Follow-up at annual 11/2012 Support to patient for continued resolution of breast abscess. Schedule for Mirena insertion.  Nigel Bridgeman CNM, [place] 06/07/2012 11:06 AM

## 2012-06-14 ENCOUNTER — Telehealth: Payer: Self-pay | Admitting: Obstetrics and Gynecology

## 2012-06-14 NOTE — Telephone Encounter (Signed)
Pt called, states she needs a note stating that she can participate in PP yoga and would like it mailed to her home.  Informed should not be a problem, msg sent to VL for approval, then will mail letter.

## 2012-06-18 ENCOUNTER — Encounter (INDEPENDENT_AMBULATORY_CARE_PROVIDER_SITE_OTHER): Payer: Self-pay | Admitting: General Surgery

## 2012-06-18 ENCOUNTER — Ambulatory Visit (INDEPENDENT_AMBULATORY_CARE_PROVIDER_SITE_OTHER): Payer: BC Managed Care – PPO | Admitting: General Surgery

## 2012-06-18 VITALS — BP 124/76 | HR 74 | Temp 97.8°F | Resp 18 | Ht 63.0 in | Wt 142.0 lb

## 2012-06-18 DIAGNOSIS — Z09 Encounter for follow-up examination after completed treatment for conditions other than malignant neoplasm: Secondary | ICD-10-CM

## 2012-06-18 NOTE — Progress Notes (Signed)
History: Patient returns approximately 3 weeks after incision and drainage of a right breast abscess. This appeared to be principally a skin and subcutaneous abscess. Cultures eventually grew MRSA. At this point she's feeling well.  Exam: Her wound is essentially healed with about a 2 mm area of clean granulation and no tracking and she has not had any drainage for several days. There is no erythema or induration.  Assessment and plan: MRSA breast abscess which is resolved. She is discharged return as needed.

## 2012-06-27 ENCOUNTER — Encounter: Payer: Self-pay | Admitting: Obstetrics and Gynecology

## 2012-06-27 ENCOUNTER — Ambulatory Visit (INDEPENDENT_AMBULATORY_CARE_PROVIDER_SITE_OTHER): Payer: BC Managed Care – PPO | Admitting: Obstetrics and Gynecology

## 2012-06-27 VITALS — BP 112/70 | Ht 63.0 in | Wt 147.0 lb

## 2012-06-27 DIAGNOSIS — Z975 Presence of (intrauterine) contraceptive device: Secondary | ICD-10-CM

## 2012-06-27 DIAGNOSIS — Z30430 Encounter for insertion of intrauterine contraceptive device: Secondary | ICD-10-CM

## 2012-06-27 LAB — POCT URINE PREGNANCY: Preg Test, Ur: NEGATIVE

## 2012-06-27 MED ORDER — LEVONORGESTREL 20 MCG/24HR IU IUD
INTRAUTERINE_SYSTEM | Freq: Once | INTRAUTERINE | Status: AC
  Administered 2012-06-27: 1 via INTRAUTERINE

## 2012-06-27 NOTE — Progress Notes (Signed)
IUD INSERTION NOTE  Susan Zamora is a 31 y.o. female G1P1001 who presents for IUD insertion.  Consent signed after risks and benefits were reviewed including but not limited to bleeding, infection, expulsion and risk of uterine perforation that may require an additional procedure for removal.  LMP: No LMP recorded. Patient is not currently having periods (Reason: Lactating). UPT: negative   Uterus assessed for size and position Prepped with Betadine  Tenaculum placed on anterior lip of cervix after Hurricane gel was applied Uterus sounded at  7.5  cm Insertion of MIRENA IUD per protocol without any complications Strings trimmed   Assessment:  IUD Insertion  Plan:  1. Patient instructed to call with oral temperature of 100.4 degrees Fahrenheit or more, excessive bleeding or pain that is not relieved with OTC analgesia taken as directed  2. Patient instructed on how  to check IUD strings and encouraged to do so after each menstrual cycle  3. Advised not to place anything in vagina or have sexual intercourse for 7 days  4. Follow-up: 4 weeks   5.  GC/CT-pending   Elizeth Weinrich PA-C 06/27/2012 2:42 PM

## 2012-06-27 NOTE — Progress Notes (Signed)
Pt states no recent intercourse Pt given 600 mg ibuprofen per protocol

## 2012-06-27 NOTE — Progress Notes (Deleted)
IUD: *** LOT#: *** Exp: *** UPT: negative GC/CHLAMYDIA: done today CONSENT SIGNED: yes DISINFECTION WITH *** X3 UTERUS SOUNDED AT *** CM IUD INSERTED PER PROTOCOL: *** COMPLICATION: *** PATIENT INSTRUCTED TO CALL IS FEVER OR ABNORMAL PAIN:{yes no:[id number]} PATIENT INSTRUCTED ON HOW TO CHECK IUD STRINGS: {yes [id number] FOLLOW UP APPT: ***  Pt states no recent intercourse Pt given 600 mg ibuprofen per protocol

## 2012-06-27 NOTE — Patient Instructions (Signed)
Call [facility] [phone]:  -for temperature of 100.4 degrees Fahrenheit or more -pain not improved with over the counter pain medications (Ibuprofen, Advil, Aleve,        Tylenol or acetaminophen) -for excessive bleeding (more than a usual period) -for any other concerns  Do not place anything in your vagina for the next 7 days    

## 2012-06-29 LAB — GC/CHLAMYDIA PROBE AMP
CT Probe RNA: NEGATIVE
GC Probe RNA: NEGATIVE

## 2012-07-16 ENCOUNTER — Telehealth: Payer: Self-pay | Admitting: Obstetrics and Gynecology

## 2012-07-16 NOTE — Telephone Encounter (Signed)
Returned pt's call. LM to return call.   

## 2012-07-26 NOTE — Telephone Encounter (Signed)
Returned pt's call.  VM left to return call if still has concerns.

## 2012-07-27 ENCOUNTER — Encounter: Payer: Self-pay | Admitting: Obstetrics and Gynecology

## 2012-07-27 ENCOUNTER — Ambulatory Visit (INDEPENDENT_AMBULATORY_CARE_PROVIDER_SITE_OTHER): Payer: BC Managed Care – PPO | Admitting: Obstetrics and Gynecology

## 2012-07-27 ENCOUNTER — Encounter: Payer: BC Managed Care – PPO | Admitting: Obstetrics and Gynecology

## 2012-07-27 VITALS — BP 104/70 | Wt 150.0 lb

## 2012-07-27 DIAGNOSIS — Z30431 Encounter for routine checking of intrauterine contraceptive device: Secondary | ICD-10-CM

## 2012-07-27 LAB — POCT URINE PREGNANCY: Preg Test, Ur: NEGATIVE

## 2012-07-27 NOTE — Progress Notes (Signed)
31 YO  reports no more cramping after 5 days post insertion.  Just stopped bleeding a week ago. (was changing pads 1-3 daily).  Patient goes on to say that she gained 5 pounds immediately after insertion, developed acne with moodiness.  Believes that it is the Mirena but wants to keep it for now.  O:  Pelvic: EGBUS-wnl, vagina-normal, cervix-string visible, uterus/adnexae-normal  A:   IUD Check-up       ? Mirena Side Efffects  P:  Patient to consider the Paragard as she is having symptoms of progestin excess        To call should she decide to proceed with the change        RTO-as scheduled or prn  Isair Inabinet, PA-C

## 2012-10-09 ENCOUNTER — Encounter: Payer: BC Managed Care – PPO | Admitting: Obstetrics and Gynecology

## 2012-10-15 ENCOUNTER — Encounter: Payer: Self-pay | Admitting: Obstetrics and Gynecology

## 2012-10-15 ENCOUNTER — Ambulatory Visit: Payer: BC Managed Care – PPO | Admitting: Obstetrics and Gynecology

## 2012-10-15 VITALS — BP 108/60 | Temp 98.7°F | Ht 63.0 in | Wt 158.0 lb

## 2012-10-15 DIAGNOSIS — Z30432 Encounter for removal of intrauterine contraceptive device: Secondary | ICD-10-CM

## 2012-10-15 DIAGNOSIS — N926 Irregular menstruation, unspecified: Secondary | ICD-10-CM

## 2012-10-15 DIAGNOSIS — N898 Other specified noninflammatory disorders of vagina: Secondary | ICD-10-CM

## 2012-10-15 DIAGNOSIS — R102 Pelvic and perineal pain: Secondary | ICD-10-CM

## 2012-10-15 LAB — POCT URINALYSIS DIPSTICK
Blood, UA: NEGATIVE
Leukocytes, UA: NEGATIVE
Nitrite, UA: NEGATIVE
pH, UA: 5

## 2012-10-15 LAB — POCT WET PREP (WET MOUNT): Whiff Test: NEGATIVE

## 2012-10-15 LAB — POCT URINE PREGNANCY: Preg Test, Ur: NEGATIVE

## 2012-10-15 NOTE — Progress Notes (Signed)
32 YO with Mirena IUD states that since insertion has gained weight (15 lbs.in past 2 months),  been depressed (denies suicidal/homicidal ideations) and is having joint pain.  Additionally she has been having pressure around her C-section incision.  Admits to wrist/elbow and knee pains all of the time, has strange dreams and is very tearful.  + irregular light bleeding-no real problem nor cramping.  Has pressure, however, around her incision with no noticeable cause or worsening event.  Will last most of the day but relieved with Advil.  Denies urinary tract symptoms, changes in bowel movements or dyspareunia.  Has had some vaginal itching.  Patient is breastfeeding.  Handout given on contraceptive methods-patient to use condoms.   O: Abdomen: soft, non-tender without masses      Pelvic: EGBUS-wnl, vagina-normal, cervix-IUD removed without difficulty, no lesions and without tenderness, uterus-normal size and adnexae-no tenderness     or masses  Wet Prep: ph-4.5,  whiff-negative otherwise negative U/A: SG-1.015,  ph-5.0 otherwise negative UPT-negative  A:  IUD Removal (weight gain, depressed mood & joint pain)      Random incision pressure      Breastfeeding      Need for Contraception  P: Patient to use condoms for now and plans to wean baby      Perineal hygiene      May consider resuming the Nuva Ring once baby is weaned      RTO-as scheduled or prn  Abisai Deer, PA-C

## 2012-10-15 NOTE — Progress Notes (Signed)
Pt states that she is having joint pain. Also states that she is having a lot of emotional issues. She states that she did not struggle with post partum depression, but has had depression since insertion of IUD. Also states that she has had issues with weigh gain. Pt states that she is having a lot of pressure around c-section incision.

## 2012-12-25 LAB — OB RESULTS CONSOLE RPR: RPR: NONREACTIVE

## 2012-12-28 ENCOUNTER — Encounter (HOSPITAL_COMMUNITY): Payer: Self-pay | Admitting: *Deleted

## 2012-12-28 ENCOUNTER — Inpatient Hospital Stay (HOSPITAL_COMMUNITY)
Admission: AD | Admit: 2012-12-28 | Discharge: 2012-12-28 | Disposition: A | Discharge status: Home / Self Care | Payer: BC Managed Care – PPO | Source: Ambulatory Visit | Attending: Obstetrics and Gynecology | Admitting: Obstetrics and Gynecology

## 2012-12-28 DIAGNOSIS — O210 Mild hyperemesis gravidarum: Secondary | ICD-10-CM | POA: Insufficient documentation

## 2012-12-28 LAB — URINALYSIS, ROUTINE W REFLEX MICROSCOPIC
Hgb urine dipstick: NEGATIVE
Ketones, ur: 40 mg/dL — AB
Nitrite: NEGATIVE
Specific Gravity, Urine: >1.03 — ABNORMAL HIGH (ref 1.005–1.030)
Urobilinogen, UA: 1 mg/dL (ref 0.0–1.0)
pH: 6 (ref 5.0–8.0)

## 2012-12-28 MED ORDER — PANTOPRAZOLE SODIUM 40 MG PO TBEC: 40.0000 mg | DELAYED_RELEASE_TABLET | Freq: Every day | ORAL | Status: DC

## 2012-12-28 MED ORDER — ONDANSETRON HCL 4 MG/2ML IJ SOLN
4.0000 mg | Freq: Once | INTRAMUSCULAR | Status: AC
  Administered 2012-12-28: 4 mg via INTRAVENOUS
  Filled 2012-12-28: qty 2

## 2012-12-28 MED ORDER — LACTATED RINGERS IV BOLUS (SEPSIS)
500.0000 mL | Freq: Once | INTRAVENOUS | Status: AC
  Administered 2012-12-28: 1000 mL via INTRAVENOUS

## 2012-12-28 MED ORDER — PANTOPRAZOLE SODIUM 40 MG IV SOLR
40.0000 mg | INTRAVENOUS | Status: DC
  Administered 2012-12-28: 40 mg via INTRAVENOUS
  Filled 2012-12-28 (×2): qty 40

## 2012-12-28 MED ORDER — PROMETHAZINE HCL 25 MG/ML IJ SOLN
12.5000 mg | INTRAMUSCULAR | Status: DC | PRN
  Administered 2012-12-28: 12.5 mg via INTRAVENOUS
  Filled 2012-12-28: qty 1

## 2012-12-28 MED ORDER — DEXTROSE IN LACTATED RINGERS 5 % IV SOLN
INTRAVENOUS | Status: DC
  Administered 2012-12-28: 17:00:00 via INTRAVENOUS

## 2012-12-28 MED ORDER — DOXYLAMINE-PYRIDOXINE 10-10 MG PO TBEC: 2.0000 | DELAYED_RELEASE_TABLET | Freq: Every day | ORAL | Status: DC

## 2012-12-28 MED ORDER — LACTATED RINGERS IV SOLN
INTRAVENOUS | Status: DC
  Administered 2012-12-28: 16:00:00 via INTRAVENOUS

## 2012-12-28 NOTE — MAU Note (Signed)
Pt states has had hyperemesis for weeks, not taking any medications at home. Denies abnormal vaginal dc chagnes or bleeding.

## 2012-12-28 NOTE — MAU Note (Signed)
Pt still feels slightly nauseated. Will ask for more nausea medication

## 2012-12-28 NOTE — MAU Provider Note (Signed)
History   32 yo G2P1001 at 9 4/7 weeks presented after calling office c/o severe N/V x several days, with inability to keep food or fluids down since earlier this week.  Hx hyperemesis with last pregnancy, has declined meds so far this pregnancy.  Patient Active Problem List   Diagnosis Date Noted  . Abscess of right breast 05/29/2012  . Pre-eclampsia 04/27/2012  . Status post primary low transverse cesarean section--FTD 04/26/2012  . Elevated blood pressure 04/25/2012  . Poor weight gain of pregnancy 03/06/2012  . Normal pregnancy, first 11/17/2011  . Abnormal Pap smear 11/04/2011     Chief Complaint  Patient presents with  . Morning Sickness    OB History   Grav Para Term Preterm Abortions TAB SAB Ect Mult Living   2 1 1  0 0 0 0 0 0 1      Past Medical History  Diagnosis Date  . Abnormal Pap smear 06/2011    ASCUS  . H/O varicella   . Hyperemesis arising during pregnancy 2013    Past Surgical History  Procedure Laterality Date  . Wisdom tooth extraction  1996    age 61  . Tonsillectomy  1985    age 40  . Cesarean section  04/26/2012    Procedure: CESAREAN SECTION;  Surgeon: Kirkland Hun, MD;  Location: [facility];  Service: Obstetrics;  Laterality: N/A;  Primary Cesarean Section [facility] Baby [facility] @ [phone],  . Irrigation and debridement abscess  05/29/2012    Procedure: IRRIGATION AND DEBRIDEMENT ABSCESS;  Surgeon: Mariella Saa, MD;  Location: [facility];  Service: General;  Laterality: Right;    Family History  Problem Relation Age of Onset  . Mental illness Mother   . Alcohol abuse Father   . Kidney disease Sister   . Alcohol abuse Sister   . Alcohol abuse Brother     History  Substance Use Topics  . Smoking status: Never Smoker   . Smokeless tobacco: Never Used  . Alcohol Use: Yes     Comment: 3 drinks per week    Allergies:  Allergies  Allergen Reactions  . Amoxicillin Nausea And Vomiting  . Codeine Nausea Only    Prescriptions prior to  admission  Medication Sig Dispense Refill  . Prenatal Vit-Fe Fumarate-FA (PRENATAL MULTIVITAMIN) TABS Take 1 tablet by mouth daily.         Physical Exam   Blood pressure 136/94, pulse 102, temperature 98.6 F (37 C), temperature source Oral, resp. rate 20, height 5' 1.5" (1.562 m), weight 150 lb (68.04 kg).  Chest clear Heart RRR without murmur Abd gravid, NT Pelvic--deferred Ext WNL  FHR 160 by bedside US, with +FM.  Results for orders placed during the hospital encounter of 12/28/12 (from the past 24 hour(s))  URINALYSIS, ROUTINE W REFLEX MICROSCOPIC     Status: Abnormal   Collection Time    12/28/12  1:25 PM      Result Value Range   Color, Urine YELLOW  YELLOW   APPearance CLEAR  CLEAR   Specific Gravity, Urine >1.030 (*) 1.005 - 1.030   pH 6.0  5.0 - 8.0   Glucose, UA NEGATIVE  NEGATIVE mg/dL   Hgb urine dipstick NEGATIVE  NEGATIVE   Bilirubin Urine SMALL (*) NEGATIVE   Ketones, ur 40 (*) NEGATIVE mg/dL   Protein, ur NEGATIVE  NEGATIVE mg/dL   Urobilinogen, UA 1.0  0.0 - 1.0 mg/dL   Nitrite NEGATIVE  NEGATIVE   Leukocytes, UA NEGATIVE  NEGATIVE  ED Course  IUP at 9 4/7 weeks Hyperemesis  Plan: IV hydration Zofran Protonix    Susan Zamora CNM, [place] 12/28/2012 2:19 PM  Addendum: Feeling much better after 2 bags IV hydration (LR and D5LR), Zofran 8 mg total, Phenergan 12.5 mg IV, and Protonix. Tolerating po fluids. Willing to try Diclegis--samples given, instructions for use reviewed. Rx Protonix 40 mg po daily. Keep scheduled appt at [facility] or call prn. ' Susan Zamora, CNM 12/28/12 7:15 pm

## 2012-12-28 NOTE — MAU Note (Signed)
Really bad morning sickness.  Let this go, thinking it would get better.  Hasn't really kept anything down since Tues.  Now  Is having upper abd pain.

## 2013-07-04 LAB — OB RESULTS CONSOLE GBS: STREP GROUP B AG: POSITIVE

## 2013-08-01 ENCOUNTER — Telehealth (HOSPITAL_COMMUNITY): Payer: Self-pay | Admitting: Certified Nurse Midwife

## 2013-08-01 ENCOUNTER — Inpatient Hospital Stay (HOSPITAL_COMMUNITY)
Admission: AD | Admit: 2013-08-01 | Discharge: 2013-08-03 | DRG: 775 | Disposition: A | Discharge status: Home / Self Care | Payer: BC Managed Care – PPO | Source: Ambulatory Visit | Attending: Obstetrics and Gynecology | Admitting: Obstetrics and Gynecology

## 2013-08-01 ENCOUNTER — Encounter (HOSPITAL_COMMUNITY): Payer: Self-pay | Admitting: *Deleted

## 2013-08-01 DIAGNOSIS — O9903 Anemia complicating the puerperium: Secondary | ICD-10-CM | POA: Diagnosis not present

## 2013-08-01 DIAGNOSIS — O9989 Other specified diseases and conditions complicating pregnancy, childbirth and the puerperium: Secondary | ICD-10-CM

## 2013-08-01 DIAGNOSIS — D649 Anemia, unspecified: Secondary | ICD-10-CM | POA: Diagnosis present

## 2013-08-01 DIAGNOSIS — Z22330 Carrier of Group B streptococcus: Secondary | ICD-10-CM

## 2013-08-01 DIAGNOSIS — O99892 Other specified diseases and conditions complicating childbirth: Secondary | ICD-10-CM | POA: Diagnosis not present

## 2013-08-01 DIAGNOSIS — O34219 Maternal care for unspecified type scar from previous cesarean delivery: Secondary | ICD-10-CM | POA: Diagnosis present

## 2013-08-01 LAB — CBC
HEMATOCRIT: 36.7 % (ref 36.0–46.0)
Hemoglobin: 12.4 g/dL (ref 12.0–15.0)
MCH: 29.1 pg (ref 26.0–34.0)
MCHC: 33.8 g/dL (ref 30.0–36.0)
MCV: 86.2 fL (ref 78.0–100.0)
Platelets: 202 10*3/uL (ref 150–400)
RBC: 4.26 MIL/uL (ref 3.87–5.11)
RDW: 14.5 % (ref 11.5–15.5)
WBC: 9.8 10*3/uL (ref 4.0–10.5)

## 2013-08-01 LAB — RAPID HIV SCREEN (WH-MAU): Rapid HIV Screen: NONREACTIVE

## 2013-08-01 LAB — TYPE AND SCREEN
ABO/RH(D): O POS
Antibody Screen: NEGATIVE

## 2013-08-01 LAB — MRSA PCR SCREENING: MRSA BY PCR: POSITIVE — AB

## 2013-08-01 MED ORDER — ONDANSETRON HCL 4 MG/2ML IJ SOLN: 4.0000 mg | INTRAMUSCULAR | Status: DC | PRN

## 2013-08-01 MED ORDER — BENZOCAINE-MENTHOL 20-0.5 % EX AERO
1.0000 "application " | INHALATION_SPRAY | CUTANEOUS | Status: DC | PRN
  Administered 2013-08-01: 1 via TOPICAL
  Filled 2013-08-01: qty 56

## 2013-08-01 MED ORDER — DIPHENHYDRAMINE HCL 25 MG PO CAPS: 25.0000 mg | ORAL_CAPSULE | Freq: Four times a day (QID) | ORAL | Status: DC | PRN

## 2013-08-01 MED ORDER — CALCIUM CARBONATE ANTACID 500 MG PO CHEW
2.0000 | CHEWABLE_TABLET | Freq: Once | ORAL | Status: AC
  Administered 2013-08-01: 400 mg via ORAL
  Filled 2013-08-01: qty 2
  Filled 2013-08-01: qty 1

## 2013-08-01 MED ORDER — LIDOCAINE HCL (PF) 1 % IJ SOLN
30.0000 mL | INTRAMUSCULAR | Status: AC | PRN
  Administered 2013-08-01: 30 mL via SUBCUTANEOUS
  Filled 2013-08-01 (×2): qty 30

## 2013-08-01 MED ORDER — IBUPROFEN 600 MG PO TABS
600.0000 mg | ORAL_TABLET | Freq: Four times a day (QID) | ORAL | Status: DC
  Administered 2013-08-01 – 2013-08-03 (×7): 600 mg via ORAL
  Filled 2013-08-01 (×7): qty 1

## 2013-08-01 MED ORDER — SENNOSIDES-DOCUSATE SODIUM 8.6-50 MG PO TABS
2.0000 | ORAL_TABLET | ORAL | Status: DC
  Administered 2013-08-03: 2 via ORAL
  Filled 2013-08-01 (×2): qty 2

## 2013-08-01 MED ORDER — LACTATED RINGERS IV SOLN
INTRAVENOUS | Status: DC
  Administered 2013-08-01: 16:00:00 via INTRAVENOUS

## 2013-08-01 MED ORDER — PRENATAL MULTIVITAMIN CH
1.0000 | ORAL_TABLET | Freq: Every day | ORAL | Status: DC
  Administered 2013-08-03: 1 via ORAL
  Filled 2013-08-01: qty 1

## 2013-08-01 MED ORDER — CITRIC ACID-SODIUM CITRATE 334-500 MG/5ML PO SOLN: 30.0000 mL | ORAL | Status: DC | PRN

## 2013-08-01 MED ORDER — OXYTOCIN 40 UNITS IN LACTATED RINGERS INFUSION - SIMPLE MED
62.5000 mL/h | INTRAVENOUS | Status: DC
  Filled 2013-08-01: qty 1000

## 2013-08-01 MED ORDER — ACETAMINOPHEN 325 MG PO TABS: 650.0000 mg | ORAL_TABLET | ORAL | Status: DC | PRN

## 2013-08-01 MED ORDER — OXYTOCIN BOLUS FROM INFUSION
500.0000 mL | INTRAVENOUS | Status: DC
  Administered 2013-08-01: 500 mL via INTRAVENOUS

## 2013-08-01 MED ORDER — ONDANSETRON HCL 4 MG/2ML IJ SOLN
4.0000 mg | Freq: Four times a day (QID) | INTRAMUSCULAR | Status: DC | PRN
Start: 2013-08-01 — End: 2013-08-01

## 2013-08-01 MED ORDER — ZOLPIDEM TARTRATE 5 MG PO TABS: 5.0000 mg | ORAL_TABLET | Freq: Every evening | ORAL | Status: DC | PRN

## 2013-08-01 MED ORDER — TETANUS-DIPHTH-ACELL PERTUSSIS 5-2.5-18.5 LF-MCG/0.5 IM SUSP
0.5000 mL | Freq: Once | INTRAMUSCULAR | Status: AC
  Administered 2013-08-02: 0.5 mL via INTRAMUSCULAR
  Filled 2013-08-01: qty 0.5

## 2013-08-01 MED ORDER — WITCH HAZEL-GLYCERIN EX PADS: 1.0000 "application " | MEDICATED_PAD | CUTANEOUS | Status: DC | PRN

## 2013-08-01 MED ORDER — DIBUCAINE 1 % RE OINT: 1.0000 "application " | TOPICAL_OINTMENT | RECTAL | Status: DC | PRN

## 2013-08-01 MED ORDER — LACTATED RINGERS IV SOLN: 500.0000 mL | INTRAVENOUS | Status: DC | PRN

## 2013-08-01 MED ORDER — SIMETHICONE 80 MG PO CHEW: 80.0000 mg | CHEWABLE_TABLET | ORAL | Status: DC | PRN

## 2013-08-01 MED ORDER — ONDANSETRON HCL 4 MG PO TABS: 4.0000 mg | ORAL_TABLET | ORAL | Status: DC | PRN

## 2013-08-01 MED ORDER — CLINDAMYCIN PHOSPHATE 900 MG/50ML IV SOLN
900.0000 mg | Freq: Once | INTRAVENOUS | Status: AC
  Administered 2013-08-01: 900 mg via INTRAVENOUS
  Filled 2013-08-01: qty 50

## 2013-08-01 MED ORDER — IBUPROFEN 600 MG PO TABS: 600.0000 mg | ORAL_TABLET | Freq: Four times a day (QID) | ORAL | Status: DC | PRN

## 2013-08-01 MED ORDER — TRAMADOL HCL 50 MG PO TABS
50.0000 mg | ORAL_TABLET | Freq: Once | ORAL | Status: AC
  Administered 2013-08-01: 50 mg via ORAL
  Filled 2013-08-01: qty 1

## 2013-08-01 MED ORDER — LANOLIN HYDROUS EX OINT: TOPICAL_OINTMENT | CUTANEOUS | Status: DC | PRN

## 2013-08-01 NOTE — L&D Delivery Note (Signed)
Delivery Note At 4:58 PM a viable female was delivered via VBAC, Spontaneous (Presentation: ; Occiput Anterior).  APGAR: 9, 9; weight 8 lb 13.3 oz (4005 g).   Placenta status: Intact, Spontaneous.  Cord: 3 vessels with the following complications: None.  Cord pH: not collected  ROM at 1030 with MSF per pt, pt told to come for admission, pt refused.  Upon arrival fluids were MSF so NICU team was called to delivery.  Cord double clamped and cut, baby taken to warm for evaluation.  Anesthesia: None  Episiotomy: None Lacerations: 2nd degree;Perineal Suture Repair: 3.0 vicryl Est. Blood Loss (mL): 300  Mom to postpartum.  Baby to Couplet care / Skin to Skin.  Routine PP orders Breastfeeding Did not get adequately treated for GBS H/o MRSA - MRSA culture on admission was pos  Lawrie Tunks 08/01/2013, 6:30 PM

## 2013-08-01 NOTE — H&P (Signed)
Alleyne Lac is a 33 y.o. female, G2P1001 at [redacted]w[redacted]d, presenting for active labor.  Patient Active Problem List   Diagnosis Date Noted  . Hyperemesis 12/28/2012  . Abscess of right breast 05/29/2012  . Pre-eclampsia 04/27/2012  . Status post primary low transverse cesarean section--FTD 04/26/2012  . Elevated blood pressure 04/25/2012  . Poor weight gain of pregnancy 03/06/2012  . Normal pregnancy, first 11/17/2011  . Abnormal Pap smear 11/04/2011    History of present pregnancy: Patient entered care at 7 weeks.   EDC of 07/29/13 was established by LMP.   Anatomy scan:  20 weeks, with normal findings and an anterior placenta.   Additional [place] evaluations:  none.   Significant prenatal events:  none   Last evaluation:  07/29/13 at 40 weeks  No SVE  OB History   Grav Para Term Preterm Abortions TAB SAB Ect Mult Living   2 1 1  0 0 0 0 0 0 1     Past Medical History  Diagnosis Date  . Abnormal Pap smear 06/2011    ASCUS  . H/O varicella   . Hyperemesis arising during pregnancy 2013   Past Surgical History  Procedure Laterality Date  . Wisdom tooth extraction  1996    age 38  . Tonsillectomy  1985    age 69  . Cesarean section  04/26/2012    Procedure: CESAREAN SECTION;  Surgeon: Ena Dawley, MD;  Location: [facility];  Service: Obstetrics;  Laterality: N/A;  Primary Cesarean Section Pablo Pena @ [phone],  . Irrigation and debridement abscess  05/29/2012    Procedure: IRRIGATION AND DEBRIDEMENT ABSCESS;  Surgeon: Edward Jolly, MD;  Location: [facility];  Service: General;  Laterality: Right;   Family History: family history includes Alcohol abuse in her brother, father, and sister; Kidney disease in her sister; Mental illness in her mother. Social History:  reports that she has never smoked. She has never used smokeless tobacco. She reports that she does not drink alcohol or use illicit drugs.   Prenatal Transfer Tool  Maternal Diabetes: No Genetic Screening:  Declined Maternal Ultrasounds/Referrals: Normal Fetal Ultrasounds or other Referrals:  None Maternal Substance Abuse:  No Significant Maternal Medications:  None Significant Maternal Lab Results: Lab values include: Group B Strep positive    ROS: see HPI above, all other systems are negative   Allergies  Allergen Reactions  . Amoxicillin Nausea And Vomiting  . Codeine Nausea Only     Dilation: 8 Effacement (%): 100 Station: 0 Exam by:: cheryl motte, rn There were no vitals taken for this visit.  Chest clear Heart RRR without murmur Abd gravid, NT Ext: WNL  FHR: Pt declines NST, agreed to intermittent monitoring  UCs:  Q75min  Prenatal labs: ABO, Rh:  O pos Antibody:  Neg Rubella:   Immune RPR:   Neg HBsAg:   Neg HIV:   Rapid NR GBS:  Pos Sickle cell/Hgb electrophoresis:  n/a Pap:  11/30/12 WNL GC:  Not done Chlamydia: Not done Genetic screenings:  Declined Glucola:  102 Other:  none   Assessment/Plan: IUP at [redacted]w[redacted]d Active labor GBS pos  Admit to BS per c/w Dr. Cletis Media Routine L&D orders Advanced labor GBS prophylaxis ordered   Emilee Hero, MSN 08/01/2013, 3:59 PM

## 2013-08-01 NOTE — Telephone Encounter (Signed)
Pt called to let CNM know that she ROM at 1030 (08/01/13), she is GBS pos, and would like to remain at home to labor.  I advised to come in to be admitted d/t her GBS pos status.  Pt declined.  Pt will keep me updated on progress.

## 2013-08-02 LAB — CBC
HCT: 28.6 % — ABNORMAL LOW (ref 36.0–46.0)
Hemoglobin: 9.7 g/dL — ABNORMAL LOW (ref 12.0–15.0)
MCH: 29.5 pg (ref 26.0–34.0)
MCHC: 33.9 g/dL (ref 30.0–36.0)
MCV: 86.9 fL (ref 78.0–100.0)
PLATELETS: 159 10*3/uL (ref 150–400)
RBC: 3.29 MIL/uL — ABNORMAL LOW (ref 3.87–5.11)
RDW: 14.5 % (ref 11.5–15.5)
WBC: 10.4 10*3/uL (ref 4.0–10.5)

## 2013-08-02 LAB — RPR: RPR Ser Ql: NONREACTIVE

## 2013-08-02 MED ORDER — PANTOPRAZOLE SODIUM 40 MG PO TBEC
40.0000 mg | DELAYED_RELEASE_TABLET | Freq: Every day | ORAL | Status: DC
  Administered 2013-08-03: 40 mg via ORAL
  Filled 2013-08-02 (×3): qty 1

## 2013-08-02 MED ORDER — CALCIUM CARBONATE ANTACID 500 MG PO CHEW
400.0000 mg | CHEWABLE_TABLET | Freq: Three times a day (TID) | ORAL | Status: DC | PRN
  Administered 2013-08-02: 400 mg via ORAL
  Filled 2013-08-02 (×2): qty 1

## 2013-08-02 NOTE — Progress Notes (Signed)
Post Partum Day 1:S/P SVB, 2nd degree laceration Subjective: Patient up ad lib, denies syncope or dizziness. Feeding:  Breast Contraceptive plan:   Undecided Per patient report, peds will D/C baby tomorrow after 5pm, due to inadequate GBS treatment.  Objective: Blood pressure 129/76, pulse 102, temperature 98.2 F (36.8 C), temperature source Oral, resp. rate 18, height 5' 1.5" (1.562 m), weight 167 lb (75.751 kg), SpO2 99.00%, unknown if currently breastfeeding.  Physical Exam:  General: alert Lochia: appropriate Uterine Fundus: firm Incision: healing well DVT Evaluation: No evidence of DVT seen on physical exam. Negative Homan's sign.   Recent Labs  08/01/13 1610 08/02/13 0621  HGB 12.4 9.7*  HCT 36.7 28.6*  Orthostatics stable.  Assessment/Plan: S/P Vaginal delivery day 1 Continue current care Plan for discharge tomorrow   LOS: 1 day   Teneil Shiller 08/02/2013, 10:28 AM

## 2013-08-02 NOTE — Lactation Note (Signed)
This note was copied from the chart of Susan Zamora. Lactation Consultation Note  Patient Name: Susan Zamora Today's Date: 08/02/2013 Reason for consult: Initial assessment of this experienced mother with her second child, now 41 hours of age. Mom denies any latching problems with newborn and breastfed first child for 6 months.  Mom informs LC that she knows how to hand express.  LC encouraged STS and cue feedings.  LC encouraged review of Baby and Me pp 9, 14 and 20-25 for STS and BF information. LC provided [employer] brochure and reviewed [facility] services and list of community and web site resources.    Maternal Data Formula Feeding for Exclusion: No Infant to breast within first hour of birth: No Breastfeeding delayed due to:: Maternal status Has patient been taught Hand Expression?: Yes (mom states she knows how to hand express) Does the patient have breastfeeding experience prior to this delivery?: Yes  Feeding Feeding Type: Breast Fed Length of feed: 60 min  LATCH Score/Interventions            LATCH scores=9 per RN assessment and baby is nursing on cue for 10-60 minutes per feeding with copious output, both voids and stools          Lactation Tools Discussed/Used   STS, hand expression, cue feedings  Consult Status Consult Status: Follow-up Date: 08/03/13 Follow-up type: In-patient    Junious Dresser [facility] 08/02/2013, 8:20 PM

## 2013-08-03 MED ORDER — IBUPROFEN 600 MG PO TABS: 600.0000 mg | ORAL_TABLET | Freq: Four times a day (QID) | ORAL | Status: DC

## 2013-08-03 NOTE — Discharge Instructions (Signed)
See Practice Brochure 

## 2013-08-03 NOTE — Progress Notes (Addendum)
Post Partum Day 2 Subjective: Reports feeling well.  Ambulating, voiding and tol po liquids and solids without difficulty.  Pos flatus and pos BM.  Breastfeeding without difficulty.  Denies weakness or dizziness.   Objective: Blood pressure 108/63, pulse 95, temperature 97.7 F (36.5 C), temperature source Oral, resp. rate 18, height 5' 1.5" (1.562 m), weight 75.751 kg (167 lb), SpO2 98.00%, unknown if currently breastfeeding.  Physical Exam:  General: alert, cooperative and no distress Heart: RRR Lungs:  CTA bilat Abd:  Soft, NT with pos BS x 4 quads. Lochia: appropriate, sm rubra Uterine Fundus: firm, NT 2 below umb Incision: Perineal repair intact DVT Evaluation: No evidence of DVT seen on physical exam. Negative Homan's sign bilat. No significant calf/ankle edema.   Recent Labs  08/01/13 1610 08/02/13 0621  HGB 12.4 9.7*  HCT 36.7 28.6*    Assessment/Plan: Stable s/p vaginal delivery-VBAC Asymptomatic anemia  Discharge to home. Discharge instructions reviewed. Rx Motrin, PNV, Iron (resume prev rxed) RTO 6wks for f/u Plans condoms for contraception.   LOS: 2 days   [place] O. 08/03/2013, 5:38 PM

## 2013-08-03 NOTE — Discharge Summary (Signed)
Obstetric Discharge Summary Reason for Admission: onset of labor Prenatal Procedures: ultrasound Intrapartum Procedures: spontaneous vaginal delivery and GBS prophylaxis Postpartum Procedures: none Complications-Operative and Postpartum: 2nd degree perineal laceration Hemoglobin  Date Value Range Status  08/02/2013 9.7* 12.0 - 15.0 g/dL Final     DELTA CHECK NOTED     REPEATED TO VERIFY     HCT  Date Value Range Status  08/02/2013 28.6* 36.0 - 46.0 % Final    Physical Exam:  General: alert, cooperative and no distress  Heart: RRR  Lungs: CTA bilat  Abd: Soft, NT with pos BS x 4 quads.  Lochia: appropriate, sm rubra  Uterine Fundus: firm, NT 2 below umb  Incision: Perineal repair intact  DVT Evaluation: No evidence of DVT seen on physical exam.  Negative Homan's sign bilat.  No significant calf/ankle edema.  Discharge Diagnoses: Term Pregnancy-delivered and VBAC-successful                                         Asymptomatic anemia  Discharge Information: Date: 08/03/2013 Activity: unrestricted Diet: routine Medications: PNV, Ibuprofen and Iron Condition: stable Instructions: refer to practice specific booklet Discharge to: home Follow-up Information   Follow up with [facility] In 6 weeks.   Contact information:   [address], [place] [address]       Schedule an appointment as soon as possible for a visit to follow up in 6 weeks.     Contraception: Condoms   Newborn Data: Live born female  Birth Weight: 8 lb 13.3 oz (4005 g) APGAR: 9, 9  Home with mother.   [place] O. 08/03/2013, 2:14 PM

## 2013-11-25 ENCOUNTER — Emergency Department (HOSPITAL_COMMUNITY)
Admission: EM | Admit: 2013-11-25 | Discharge: 2013-11-26 | Disposition: A | Discharge status: Home / Self Care | Payer: BC Managed Care – PPO | Attending: Emergency Medicine | Admitting: Emergency Medicine

## 2013-11-25 ENCOUNTER — Encounter (HOSPITAL_COMMUNITY): Payer: Self-pay | Admitting: Emergency Medicine

## 2013-11-25 DIAGNOSIS — Z792 Long term (current) use of antibiotics: Secondary | ICD-10-CM | POA: Insufficient documentation

## 2013-11-25 DIAGNOSIS — L03119 Cellulitis of unspecified part of limb: Secondary | ICD-10-CM

## 2013-11-25 DIAGNOSIS — Y929 Unspecified place or not applicable: Secondary | ICD-10-CM | POA: Insufficient documentation

## 2013-11-25 DIAGNOSIS — W57XXXA Bitten or stung by nonvenomous insect and other nonvenomous arthropods, initial encounter: Principal | ICD-10-CM

## 2013-11-25 DIAGNOSIS — Y939 Activity, unspecified: Secondary | ICD-10-CM | POA: Insufficient documentation

## 2013-11-25 DIAGNOSIS — Z79899 Other long term (current) drug therapy: Secondary | ICD-10-CM | POA: Insufficient documentation

## 2013-11-25 DIAGNOSIS — S80869A Insect bite (nonvenomous), unspecified lower leg, initial encounter: Principal | ICD-10-CM

## 2013-11-25 DIAGNOSIS — Z8614 Personal history of Methicillin resistant Staphylococcus aureus infection: Secondary | ICD-10-CM | POA: Insufficient documentation

## 2013-11-25 DIAGNOSIS — L02416 Cutaneous abscess of left lower limb: Secondary | ICD-10-CM

## 2013-11-25 DIAGNOSIS — L089 Local infection of the skin and subcutaneous tissue, unspecified: Secondary | ICD-10-CM | POA: Insufficient documentation

## 2013-11-25 DIAGNOSIS — Z791 Long term (current) use of non-steroidal anti-inflammatories (NSAID): Secondary | ICD-10-CM | POA: Insufficient documentation

## 2013-11-25 DIAGNOSIS — L02419 Cutaneous abscess of limb, unspecified: Secondary | ICD-10-CM | POA: Insufficient documentation

## 2013-11-25 DIAGNOSIS — Z880 Allergy status to penicillin: Secondary | ICD-10-CM | POA: Insufficient documentation

## 2013-11-25 DIAGNOSIS — IMO0002 Reserved for concepts with insufficient information to code with codable children: Secondary | ICD-10-CM | POA: Insufficient documentation

## 2013-11-25 DIAGNOSIS — Z8619 Personal history of other infectious and parasitic diseases: Secondary | ICD-10-CM | POA: Insufficient documentation

## 2013-11-25 MED ORDER — OXYCODONE-ACETAMINOPHEN 5-325 MG PO TABS: 1.0000 | ORAL_TABLET | ORAL | Status: DC | PRN

## 2013-11-25 MED ORDER — IBUPROFEN 800 MG PO TABS
800.0000 mg | ORAL_TABLET | Freq: Once | ORAL | Status: AC
  Administered 2013-11-25: 800 mg via ORAL
  Filled 2013-11-25: qty 1

## 2013-11-25 MED ORDER — OXYCODONE-ACETAMINOPHEN 5-325 MG PO TABS
2.0000 | ORAL_TABLET | Freq: Once | ORAL | Status: AC
  Administered 2013-11-25: 2 via ORAL
  Filled 2013-11-25: qty 2

## 2013-11-25 NOTE — ED Notes (Signed)
Significant other at bedside.

## 2013-11-25 NOTE — Discharge Instructions (Signed)
Abscess Care After An abscess (also called a boil or furuncle) is an infected area that contains a collection of pus. Signs and symptoms of an abscess include pain, tenderness, redness, or hardness, or you may feel a moveable soft area under your skin. An abscess can occur anywhere in the body. The infection may spread to surrounding tissues causing cellulitis. A cut (incision) by the surgeon was made over your abscess and the pus was drained out. Gauze may have been packed into the space to provide a drain that will allow the cavity to heal from the inside outwards. The boil may be painful for 5 to 7 days. Most people with a boil do not have high fevers. Your abscess, if seen early, may not have localized, and may not have been lanced. If not, another appointment may be required for this if it does not get better on its own or with medications. HOME CARE INSTRUCTIONS   Only take over-the-counter or prescription medicines for pain, discomfort, or fever as directed by your caregiver.  When you bathe, soak and then remove gauze or iodoform packs at least daily or as directed by your caregiver. You may then wash the wound gently with mild soapy water. Repack with gauze or do as your caregiver directs. SEEK IMMEDIATE MEDICAL CARE IF:   You develop increased pain, swelling, redness, drainage, or bleeding in the wound site.  You develop signs of generalized infection including muscle aches, chills, fever, or a general ill feeling.  An oral temperature above 102 F (38.9 C) develops, not controlled by medication. See your caregiver for a recheck if you develop any of the symptoms described above. If medications ( Abscess An abscess is an infected area that contains a collection of pus and debris.It can occur in almost any part of the body. An abscess is also known as a furuncle or boil. CAUSES  An abscess occurs when tissue gets infected. This can occur from blockage of oil or sweat glands, infection  of hair follicles, or a minor injury to the skin. As the body tries to fight the infection, pus collects in the area and creates pressure under the skin. This pressure causes pain. People with weakened immune systems have difficulty fighting infections and get certain abscesses more often.  SYMPTOMS Usually an abscess develops on the skin and becomes a painful mass that is red, warm, and tender. If the abscess forms under the skin, you may feel a moveable soft area under the skin. Some abscesses break open (rupture) on their own, but most will continue to get worse without care. The infection can spread deeper into the body and eventually into the bloodstream, causing you to feel ill.  DIAGNOSIS  Your caregiver will take your medical history and perform a physical exam. A sample of fluid may also be taken from the abscess to determine what is causing your infection. TREATMENT  Your caregiver may prescribe antibiotic medicines to fight the infection. However, taking antibiotics alone usually does not cure an abscess. Your caregiver may need to make a small cut (incision) in the abscess to drain the pus. In some cases, gauze is packed into the abscess to reduce pain and to continue draining the area. HOME CARE INSTRUCTIONS   Only take over-the-counter or prescription medicines for pain, discomfort, or fever as directed by your caregiver.  If you were prescribed antibiotics, take them as directed. Finish them even if you start to feel better.  If gauze is used, follow your  caregiver's directions for changing the gauze.  To avoid spreading the infection:  Keep your draining abscess covered with a bandage.  Wash your hands well.  Do not share personal care items, towels, or whirlpools with others.  Avoid skin contact with others.  Keep your skin and clothes clean around the abscess.  Keep all follow-up appointments as directed by your caregiver. SEEK MEDICAL CARE IF:   You have increased  pain, swelling, redness, fluid drainage, or bleeding.  You have muscle aches, chills, or a general ill feeling.  You have a fever. MAKE SURE YOU:   Understand these instructions.  Will watch your condition.  Will get help right away if you are not doing well or get worse. Document Released: 04/27/2005 Document Revised: 01/17/2012 Document Reviewed: 09/30/2011 [facility] Patient Information 2014 [employer], [place]. antibiotics) were prescribed, take them as directed. Document Released: 02/03/2005 Document Revised: 10/10/2011 Document Reviewed: 10/01/2007 [facility] Patient Information 2014 [place].

## 2013-11-25 NOTE — ED Notes (Signed)
MD at bedside. 

## 2013-11-25 NOTE — ED Provider Notes (Signed)
CSN: [id number]     Arrival date & time 11/25/13  2122 History   First MD Initiated Contact with Patient 11/25/13 2250     Chief Complaint  Patient presents with  . Abscess     (Consider location/radiation/quality/duration/timing/severity/associated sxs/prior Treatment) HPI  33 year old female with painful left thigh lesion. Gradual onset approximately 6 days ago and progressively worsening. She initially went to urgent care and was diagnosed with "a spider bite." Started on Bactrim. Despite this her symptoms have progressed. She does have a past history of a breast abscess which she reports is MRSA. No fevers or chills. No history of diabetes or other immunocompromising state.  Past Medical History  Diagnosis Date  . Abnormal Pap smear 06/2011    ASCUS  . H/O varicella   . Hyperemesis arising during pregnancy 2013   Past Surgical History  Procedure Laterality Date  . Wisdom tooth extraction  1996    age 25  . Tonsillectomy  1985    age 22  . Cesarean section  04/26/2012    Procedure: CESAREAN SECTION;  Surgeon: Ena Dawley, MD;  Location: [facility];  Service: Obstetrics;  Laterality: N/A;  Primary Cesarean Section [place] @ [phone],  . Irrigation and debridement abscess  05/29/2012    Procedure: IRRIGATION AND DEBRIDEMENT ABSCESS;  Surgeon: Edward Jolly, MD;  Location: [facility];  Service: General;  Laterality: Right;   Family History  Problem Relation Age of Onset  . Mental illness Mother   . Alcohol abuse Father   . Kidney disease Sister   . Alcohol abuse Sister   . Alcohol abuse Brother    History  Substance Use Topics  . Smoking status: Never Smoker   . Smokeless tobacco: Never Used  . Alcohol Use: No     Comment: 3 drinks per week   OB History   Grav Para Term Preterm Abortions TAB SAB Ect Mult Living   2 2 2  0 0 0 0 0 0 2     Review of Systems  All systems reviewed and negative, other than as noted in HPI.   Allergies  Amoxicillin and  Codeine  Home Medications   Prior to Admission medications   Medication Sig Start Date End Date Taking? Authorizing Provider  ibuprofen (ADVIL,MOTRIN) 600 MG tablet Take 1 tablet (600 mg total) by mouth every 6 (six) hours. 08/03/13  Yes Quentin Cornwall, CNM  neomycin-polymyxin-dexamethasone (MAXITROL) 0.1 % ophthalmic suspension Place 2 drops into the right eye 3 (three) times daily.   Yes Historical Provider, MD  Prenatal Vit-Fe Fumarate-FA (PRENATAL MULTIVITAMIN) TABS Take 1 tablet by mouth daily.   Yes Historical Provider, MD  Probiotic Product (PROBIOTIC DAILY PO) Take 1 capsule by mouth daily.   Yes Historical Provider, MD  sulfamethoxazole-trimethoprim (BACTRIM DS) 800-160 MG per tablet Take 1 tablet by mouth 2 (two) times daily.   Yes Historical Provider, MD  oxyCODONE-acetaminophen (PERCOCET/ROXICET) 5-325 MG per tablet Take 1-2 tablets by mouth every 4 (four) hours as needed for severe pain. 11/25/13   Virgel Manifold, MD   BP 140/85  Pulse 93  Temp(Src) 98.2 F (36.8 C) (Oral)  Resp 18  Wt 150 lb (68.04 kg)  SpO2 100% Physical Exam  Nursing note and vitals reviewed. Constitutional: She appears well-developed and well-nourished. No distress.  HENT:  Head: Normocephalic and atraumatic.  Eyes: Conjunctivae are normal. Right eye exhibits no discharge. Left eye exhibits no discharge.  Neck: Neck supple.  Cardiovascular: Normal rate, regular rhythm and  normal heart sounds.  Exam reveals no gallop and no friction rub.   No murmur heard. Pulmonary/Chest: Effort normal and breath sounds normal. No respiratory distress.  Abdominal: Soft. She exhibits no distension. There is no tenderness.  Musculoskeletal: She exhibits no edema and no tenderness.  Neurological: She is alert.  Skin: Skin is warm and dry.  Lesion to proximal L lateral thigh consistent with an abscess. Coming to point with some spontaneous purulent drainage. Center fluctuant. ~2cm of surrounding induration. ~3cm of  erythema/increased warmth/tenderness surrounding that.   Psychiatric: She has a normal mood and affect. Her behavior is normal. Thought content normal.    ED Course  Procedures (including critical care time)  INCISION AND DRAINAGE Performed by: Virgel Manifold Consent: Verbal consent obtained. Risks and benefits: risks, benefits and alternatives were discussed Type: abscess  Body area: L thigh  Anesthesia: local infiltration  Incision was made with a scalpel.  Local anesthetic: lidocaine 1 % w/o epinephrine  Anesthetic total: 4 ml  Complexity: complex Blunt dissection to break up loculations  Drainage: purulent  Drainage amount: moderate  Packing material: none  Patient tolerance: Patient tolerated the procedure well with no immediate complications.    Labs Review Labs Reviewed - No data to display  Imaging Review No results found.   EKG Interpretation None      MDM   Final diagnoses:  Abscess of left thigh    32yF with abscess L thigh. Not systemically ill. Previously prescribed bactrim. Mild cellulitic component so will have continue. Breastfeeding, but infant over 18 months of age so should be fine to continue. PRN pain meds. Warm soaks/continued care discussed. Return precautions discussed. Outpt FU as needed otherwise.     Virgel Manifold, MD 11/25/13 [phone]

## 2013-11-25 NOTE — ED Notes (Signed)
Pt reports that she has had an abscess to her lateral L upper leg x6 days, seen at Urgent care x3 days ago and placed on ABX, but has progressed in size and pain. Pt was told this appeared to be a spider bite, but has a hx of MRSA. Pt states that today the abscess began to drain. Covered by a bandage at this time. Pt a&o x4, ambulatory to triage.

## 2014-06-02 ENCOUNTER — Encounter (HOSPITAL_COMMUNITY): Payer: Self-pay | Admitting: Emergency Medicine

## 2014-09-01 ENCOUNTER — Other Ambulatory Visit: Payer: Self-pay | Admitting: Obstetrics and Gynecology

## 2014-09-01 ENCOUNTER — Encounter (HOSPITAL_COMMUNITY): Payer: Self-pay | Admitting: *Deleted

## 2014-09-01 DIAGNOSIS — Z880 Allergy status to penicillin: Secondary | ICD-10-CM

## 2014-09-01 DIAGNOSIS — F329 Major depressive disorder, single episode, unspecified: Secondary | ICD-10-CM | POA: Diagnosis not present

## 2014-09-01 DIAGNOSIS — O034 Incomplete spontaneous abortion without complication: Secondary | ICD-10-CM | POA: Diagnosis present

## 2014-09-01 DIAGNOSIS — F32A Depression, unspecified: Secondary | ICD-10-CM | POA: Diagnosis not present

## 2014-09-01 NOTE — H&P (Signed)
Susan Zamora is an 34 y.o. female, presenting on 09/02/14 for scheduled D&E due to retained products of conception from missed SAB 07/16/14. Had not had any bleeding or pain to that point, or since then.  Patient had NOB visit on 07/16/14, with physical measurement of uterus S<D.  US showed 6 6/7 week IUFD, with calcified gestational sac.  QHCG that day was 2846, with weekly QHCGs showing the following values: 07/22/14 1362 07/29/14   580 08/05/14    187 08/12/14     93 08/19/14     54 08/26/14     42  Seen on 08/29/14 to discuss slow dropping of QHCG.  [place] that day showed persistent IUGS of 7 1/7 week size, but no fetal pole. Patient desired to proceed with D&E this week.  Patient Active Problem List   Diagnosis Date Noted  . Depression 09/01/2014  . Retained products of conception following SAB 09/01/2014    Pertinent Gynecological History: OB HX:   04/2012:  Primary LTCS, due to FTD, pre-eclampsia, Dr. Tory Emerald breast abscess 05/2012, requiring I&D 08/2013:  VBAC, without complications, [facility] 81/19/14:  SAB at 6 6/7 weeks  MEDICAL/FAMILY/SOCIAL HX: No LMP since SAB dx 07/16/14.    Past Medical History  Diagnosis Date  . Abnormal Pap smear 06/2011    ASCUS  . H/O varicella   . Hyperemesis arising during pregnancy 2013  . Depression     Past Surgical History  Procedure Laterality Date  . Wisdom tooth extraction  1996    age 7  . Tonsillectomy  1985    age 89  . Cesarean section  04/26/2012    Procedure: CESAREAN SECTION;  Surgeon: Ena Dawley, MD;  Location: [facility];  Service: Obstetrics;  Laterality: N/A;  Primary Cesarean Section [place] @ [phone],  . Irrigation and debridement abscess  05/29/2012    Procedure: IRRIGATION AND DEBRIDEMENT ABSCESS;  Surgeon: Edward Jolly, MD;  Location: [facility];  Service: General;  Laterality: Right;    Family History  Problem Relation Age of Onset  . Mental illness Mother   . Alcohol abuse Father   . Kidney disease  Sister   . Alcohol abuse Sister   . Alcohol abuse Brother     Social History:  reports that she has never smoked. She has never used smokeless tobacco. She reports that she drinks alcohol. She reports that she does not use illicit drugs.  Patient is Caucasian, married, with supportive husband.    ALLERGIES/MEDS:  Allergies:  Allergies  Allergen Reactions  . Amoxicillin Nausea And Vomiting  . Codeine Nausea Only    No prescriptions prior to admission     ROS:  Occasional mild cramping  Filed Vitals:   09/02/14 1248  BP: 130/80  Pulse: 68  Temp: 98.2 F (36.8 C)  Resp: 20     Height 5\' 1"  (1.549 m), weight 150 lb (68.04 kg), unknown if currently breastfeeding. Physical Exam  Chest clear Heart RRR without murmur Abd soft, NT Pelvic--deferred  Ext WNL  Hgb 12/16 - 13.3 GC/chlamydia negative 07/16/14 Blood type O+    ASSESSMENT: Missed AB 07/16/14, with retained POC O+ type Patient desires D&E.  PLAN: Ad[facility] to [facility]14 for scheduled D&E per consult with Dr. Mancel Bale. Routine pre-op orders Support to patient for pregnancy loss.   Donnel Saxon CNM 09/01/2014, 4:19 PM  Agree with above.  R/B/A discussed with the patient, consent signed and witnessed and questions answered.

## 2014-09-02 ENCOUNTER — Ambulatory Visit (HOSPITAL_COMMUNITY): Payer: BLUE CROSS/BLUE SHIELD | Admitting: Anesthesiology

## 2014-09-02 ENCOUNTER — Ambulatory Visit (HOSPITAL_COMMUNITY)
Admission: RE | Admit: 2014-09-02 | Discharge: 2014-09-02 | Disposition: A | Discharge status: Home / Self Care | Payer: BLUE CROSS/BLUE SHIELD | Source: Ambulatory Visit | Attending: Obstetrics and Gynecology | Admitting: Obstetrics and Gynecology

## 2014-09-02 ENCOUNTER — Encounter (HOSPITAL_COMMUNITY)
Admission: RE | Disposition: A | Discharge status: Home / Self Care | Payer: Self-pay | Source: Ambulatory Visit | Attending: Obstetrics and Gynecology

## 2014-09-02 DIAGNOSIS — O021 Missed abortion: Secondary | ICD-10-CM | POA: Insufficient documentation

## 2014-09-02 DIAGNOSIS — F329 Major depressive disorder, single episode, unspecified: Secondary | ICD-10-CM | POA: Diagnosis not present

## 2014-09-02 DIAGNOSIS — Z3A01 Less than 8 weeks gestation of pregnancy: Secondary | ICD-10-CM | POA: Diagnosis not present

## 2014-09-02 DIAGNOSIS — Z886 Allergy status to analgesic agent status: Secondary | ICD-10-CM | POA: Diagnosis not present

## 2014-09-02 DIAGNOSIS — F32A Depression, unspecified: Secondary | ICD-10-CM | POA: Diagnosis not present

## 2014-09-02 DIAGNOSIS — Z881 Allergy status to other antibiotic agents status: Secondary | ICD-10-CM | POA: Insufficient documentation

## 2014-09-02 DIAGNOSIS — Z880 Allergy status to penicillin: Secondary | ICD-10-CM

## 2014-09-02 DIAGNOSIS — O034 Incomplete spontaneous abortion without complication: Secondary | ICD-10-CM | POA: Diagnosis present

## 2014-09-02 HISTORY — PX: DILATION AND EVACUATION: SHX1459

## 2014-09-02 HISTORY — DX: Major depressive disorder, single episode, unspecified: F32.9

## 2014-09-02 HISTORY — DX: Depression, unspecified: F32.A

## 2014-09-02 LAB — SURGICAL PCR SCREEN
MRSA, PCR: POSITIVE — AB
Staphylococcus aureus: POSITIVE — AB

## 2014-09-02 LAB — CBC
HCT: 37.9 % (ref 36.0–46.0)
Hemoglobin: 12.7 g/dL (ref 12.0–15.0)
MCH: 30 pg (ref 26.0–34.0)
MCHC: 33.5 g/dL (ref 30.0–36.0)
MCV: 89.4 fL (ref 78.0–100.0)
Platelets: 243 10*3/uL (ref 150–400)
RBC: 4.24 MIL/uL (ref 3.87–5.11)
RDW: 13 % (ref 11.5–15.5)
WBC: 7.1 10*3/uL (ref 4.0–10.5)

## 2014-09-02 SURGERY — DILATION AND EVACUATION, UTERUS
Anesthesia: Monitor Anesthesia Care

## 2014-09-02 MED ORDER — ACETAMINOPHEN 160 MG/5ML PO SOLN
960.0000 mg | Freq: Four times a day (QID) | ORAL | Status: DC | PRN
  Administered 2014-09-02 (×2): 960 mg via ORAL

## 2014-09-02 MED ORDER — PROPOFOL 10 MG/ML IV BOLUS
INTRAVENOUS | Status: AC
  Filled 2014-09-02: qty 20

## 2014-09-02 MED ORDER — LIDOCAINE HCL (CARDIAC) 20 MG/ML IV SOLN
INTRAVENOUS | Status: AC
  Filled 2014-09-02: qty 5

## 2014-09-02 MED ORDER — MUPIROCIN 2 % EX OINT
1.0000 "application " | TOPICAL_OINTMENT | Freq: Once | CUTANEOUS | Status: AC
  Administered 2014-09-02: 1 via TOPICAL

## 2014-09-02 MED ORDER — LIDOCAINE HCL 2 % IJ SOLN
INTRAMUSCULAR | Status: AC
  Filled 2014-09-02: qty 20

## 2014-09-02 MED ORDER — KETOROLAC TROMETHAMINE 30 MG/ML IJ SOLN
INTRAMUSCULAR | Status: AC
  Filled 2014-09-02: qty 1

## 2014-09-02 MED ORDER — DEXAMETHASONE SODIUM PHOSPHATE 4 MG/ML IJ SOLN
INTRAMUSCULAR | Status: AC
  Filled 2014-09-02: qty 1

## 2014-09-02 MED ORDER — 0.9 % SODIUM CHLORIDE (POUR BTL) OPTIME
TOPICAL | Status: DC | PRN
  Administered 2014-09-02: 1000 mL

## 2014-09-02 MED ORDER — OXYCODONE-ACETAMINOPHEN 5-325 MG PO TABS
1.0000 | ORAL_TABLET | ORAL | Status: DC | PRN
  Administered 2014-09-02: 1 via ORAL

## 2014-09-02 MED ORDER — OXYCODONE-ACETAMINOPHEN 5-325 MG PO TABS
ORAL_TABLET | ORAL | Status: AC
  Filled 2014-09-02: qty 1

## 2014-09-02 MED ORDER — PROPOFOL 10 MG/ML IV BOLUS
INTRAVENOUS | Status: AC
Start: 2014-09-02 — End: 2014-09-02
  Filled 2014-09-02: qty 20

## 2014-09-02 MED ORDER — LACTATED RINGERS IV SOLN
INTRAVENOUS | Status: DC
  Administered 2014-09-02: 13:00:00 via INTRAVENOUS

## 2014-09-02 MED ORDER — MUPIROCIN 2 % EX OINT
TOPICAL_OINTMENT | CUTANEOUS | Status: AC
  Administered 2014-09-02: 1 via TOPICAL
  Filled 2014-09-02: qty 22

## 2014-09-02 MED ORDER — KETOROLAC TROMETHAMINE 30 MG/ML IJ SOLN
INTRAMUSCULAR | Status: DC | PRN
  Administered 2014-09-02: 30 mg via INTRAVENOUS

## 2014-09-02 MED ORDER — GLYCOPYRROLATE 0.2 MG/ML IJ SOLN
INTRAMUSCULAR | Status: AC
  Filled 2014-09-02: qty 1

## 2014-09-02 MED ORDER — ONDANSETRON HCL 4 MG/2ML IJ SOLN
INTRAMUSCULAR | Status: DC | PRN
  Administered 2014-09-02: 4 mg via INTRAVENOUS

## 2014-09-02 MED ORDER — DEXAMETHASONE SODIUM PHOSPHATE 10 MG/ML IJ SOLN
INTRAMUSCULAR | Status: DC | PRN
  Administered 2014-09-02: 4 mg via INTRAVENOUS

## 2014-09-02 MED ORDER — SCOPOLAMINE 1 MG/3DAYS TD PT72
1.0000 | MEDICATED_PATCH | Freq: Once | TRANSDERMAL | Status: DC
  Administered 2014-09-02: 1.5 mg via TRANSDERMAL

## 2014-09-02 MED ORDER — PROPOFOL 10 MG/ML IV EMUL
INTRAVENOUS | Status: DC | PRN
  Administered 2014-09-02 (×2): 50 mg via INTRAVENOUS
  Administered 2014-09-02 (×2): 100 mg via INTRAVENOUS
  Administered 2014-09-02: 50 mg via INTRAVENOUS
  Administered 2014-09-02: 100 mg via INTRAVENOUS

## 2014-09-02 MED ORDER — IBUPROFEN 600 MG PO TABS: 600.0000 mg | ORAL_TABLET | Freq: Four times a day (QID) | ORAL | Status: DC | PRN

## 2014-09-02 MED ORDER — FENTANYL CITRATE 0.05 MG/ML IJ SOLN: 25.0000 ug | INTRAMUSCULAR | Status: DC | PRN

## 2014-09-02 MED ORDER — FENTANYL CITRATE 0.05 MG/ML IJ SOLN
INTRAMUSCULAR | Status: AC
  Filled 2014-09-02: qty 2

## 2014-09-02 MED ORDER — ACETAMINOPHEN 160 MG/5ML PO SOLN
ORAL | Status: AC
  Filled 2014-09-02: qty 20.3

## 2014-09-02 MED ORDER — MIDAZOLAM HCL 2 MG/2ML IJ SOLN
INTRAMUSCULAR | Status: DC | PRN
  Administered 2014-09-02: 2 mg via INTRAVENOUS

## 2014-09-02 MED ORDER — SCOPOLAMINE 1 MG/3DAYS TD PT72
MEDICATED_PATCH | TRANSDERMAL | Status: AC
  Filled 2014-09-02: qty 1

## 2014-09-02 MED ORDER — MIDAZOLAM HCL 2 MG/2ML IJ SOLN
INTRAMUSCULAR | Status: AC
  Filled 2014-09-02: qty 2

## 2014-09-02 MED ORDER — ONDANSETRON HCL 4 MG/2ML IJ SOLN
INTRAMUSCULAR | Status: AC
  Filled 2014-09-02: qty 2

## 2014-09-02 MED ORDER — LIDOCAINE HCL 2 % IJ SOLN
INTRAMUSCULAR | Status: DC | PRN
  Administered 2014-09-02: 10 mL

## 2014-09-02 MED ORDER — OXYCODONE-ACETAMINOPHEN 5-325 MG PO TABS: 1.0000 | ORAL_TABLET | Freq: Four times a day (QID) | ORAL | Status: DC | PRN

## 2014-09-02 MED ORDER — FENTANYL CITRATE 0.05 MG/ML IJ SOLN
INTRAMUSCULAR | Status: DC | PRN
  Administered 2014-09-02 (×2): 50 ug via INTRAVENOUS

## 2014-09-02 SURGICAL SUPPLY — 18 items
CATH ROBINSON RED A/P 16FR (CATHETERS) ×3 IMPLANT
CLOTH BEACON ORANGE TIMEOUT ST (SAFETY) ×3 IMPLANT
DECANTER SPIKE VIAL GLASS SM (MISCELLANEOUS) ×3 IMPLANT
GLOVE BIO SURGEON STRL SZ7.5 (GLOVE) ×3 IMPLANT
GLOVE BIOGEL PI IND STRL 7.5 (GLOVE) ×2 IMPLANT
GLOVE BIOGEL PI INDICATOR 7.5 (GLOVE) ×4
GOWN STRL REUS W/TWL LRG LVL3 (GOWN DISPOSABLE) ×6 IMPLANT
KIT BERKELEY 1ST TRIMESTER 3/8 (MISCELLANEOUS) ×3 IMPLANT
NS IRRIG 1000ML POUR BTL (IV SOLUTION) ×3 IMPLANT
PACK VAGINAL MINOR WOMEN LF (CUSTOM PROCEDURE TRAY) ×3 IMPLANT
PAD OB MATERNITY 4.3X12.25 (PERSONAL CARE ITEMS) ×3 IMPLANT
PAD PREP 24X48 CUFFED NSTRL (MISCELLANEOUS) ×3 IMPLANT
SET BERKELEY SUCTION TUBING (SUCTIONS) ×3 IMPLANT
TOWEL OR 17X24 6PK STRL BLUE (TOWEL DISPOSABLE) ×6 IMPLANT
VACURETTE 10 RIGID CVD (CANNULA) IMPLANT
VACURETTE 7MM CVD STRL WRAP (CANNULA) IMPLANT
VACURETTE 8 RIGID CVD (CANNULA) ×2 IMPLANT
VACURETTE 9 RIGID CVD (CANNULA) IMPLANT

## 2014-09-02 NOTE — Transfer of Care (Signed)
Immediate Anesthesia Transfer of Care Note  Patient: Susan Zamora  Procedure(s) Performed: Procedure(s): DILATATION AND EVACUATION (N/A)  Patient Location: PACU  Anesthesia Type:MAC  Level of Consciousness: awake, alert  and oriented  Airway & Oxygen Therapy: Patient Spontanous Breathing  Post-op Assessment: Report given to RN, Post -op Vital signs reviewed and stable and Patient moving all extremities X 4  Post vital signs: Reviewed and stable  Last Vitals:  Filed Vitals:   09/02/14 1248  BP: 130/80  Pulse: 68  Temp: 36.8 C  Resp: 20    Complications: No apparent anesthesia complications

## 2014-09-02 NOTE — Op Note (Signed)
Preop Diagnosis: Retained Products of Conception   Postop Diagnosis: Retained Products of Conception   Procedure: SUCTION DILATATION AND CURETTAGE   Anesthesia: Monitor Anesthesia Care   Attending: Delice Lesch, MD   Assistant: N/a   Findings: N/a  Pathology: Small amount of POCs  Fluids: 1500 cc  UOP: Straight cath prior to procedure  EBL: Minimal  Complications: None  Procedure: The patient was taken to the operating room after the risks benefits and alternatives were discussed with the patient, the patient verbalized understanding and consent signed and witnessed.  The patient was placed under MAC anesthesia, prepped and draped in the normal sterile fashion and a time out was performed.  A bivalve speculum was placed in the patient's vagina and the anterior lip of the cervix grasped with a single-tooth tenaculum. A paracervical block was administered using a total of 10 cc of 2% lidocaine.  The uterus was sounded to 9 cm and a size 8 suction curette was used. Suction curettage was performed until minimal tissue returned. Sharp curettage was performed until a gritty texture was noted. Suction curettage was performed once again to remove any remaining debris. All instruments were removed. The count was correct. The patient was transferred to the recovery room in good condition.

## 2014-09-02 NOTE — Discharge Instructions (Signed)
DISCHARGE INSTRUCTIONS: D&C / D&E The following instructions have been prepared to help you care for yourself upon your return home.  No Ibuprofen containing products (ie. Advil, Aleve, Motrin, etc.) until after 8:45 pm tonight.   Personal hygiene:  Use sanitary pads for vaginal drainage, not tampons.  Shower the day after your procedure.  NO tub baths, pools or Jacuzzis for 2-3 weeks.  Wipe front to back after using the bathroom.  Activity and limitations:  Do NOT drive or operate any equipment for 24 hours. The effects of anesthesia are still present and drowsiness may result.  Do NOT rest in bed all day.  Walking is encouraged.  Walk up and down stairs slowly.  You may resume your normal activity in one to two days or as indicated by your physician.  Sexual activity: NO intercourse for at least 2 weeks after the procedure, or as indicated by your physician.  Diet: Eat a light meal as desired this evening. You may resume your usual diet tomorrow.  Return to work: You may resume your work activities in one to two days or as indicated by your doctor.  What to expect after your surgery: Expect to have vaginal bleeding/discharge for 2-3 days and spotting for up to 10 days. It is not unusual to have soreness for up to 1-2 weeks. You may have a slight burning sensation when you urinate for the first day. Mild cramps may continue for a couple of days. You may have a regular period in 2-6 weeks.  Call your doctor for any of the following:  Excessive vaginal bleeding, saturating and changing one pad every hour.  Inability to urinate 6 hours after discharge from hospital.  Pain not relieved by pain medication.  Fever of 100.4 F or greater.  Unusual vaginal discharge or odor.   Call for an appointment:    Patients signature: ______________________  Nurses signature ________________________  Support person's signature_______________________

## 2014-09-02 NOTE — Anesthesia Preprocedure Evaluation (Signed)
Anesthesia Evaluation  Patient identified by MRN, date of birth, ID band Patient awake    Reviewed: Allergy & Precautions, H&P , Patient's Chart, lab work & pertinent test results, reviewed documented beta blocker date and time   Airway Mallampati: II  TM Distance: >3 FB Neck ROM: full    Dental no notable dental hx.    Pulmonary  breath sounds clear to auscultation  Pulmonary exam normal       Cardiovascular Rhythm:regular Rate:Normal     Neuro/Psych    GI/Hepatic   Endo/Other    Renal/GU      Musculoskeletal   Abdominal   Peds  Hematology   Anesthesia Other Findings   Reproductive/Obstetrics                             Anesthesia Physical Anesthesia Plan  ASA: II  Anesthesia Plan: MAC   Post-op Pain Management:    Induction: Intravenous  Airway Management Planned: Mask and Natural Airway  Additional Equipment:   Intra-op Plan:   Post-operative Plan:   Informed Consent: I have reviewed the patients History and Physical, chart, labs and discussed the procedure including the risks, benefits and alternatives for the proposed anesthesia with the patient or authorized representative who has indicated his/her understanding and acceptance.   Dental Advisory Given  Plan Discussed with: CRNA and Surgeon  Anesthesia Plan Comments: (Discussed TIVA sedation and potential to need to place airway or ETT if warranted by clinical changes intra-operatively. We will start procedure as MAC.)        Anesthesia Quick Evaluation

## 2014-09-02 NOTE — Anesthesia Postprocedure Evaluation (Signed)
Anesthesia Post Note  Patient: Susan Zamora  Procedure(s) Performed: Procedure(s) (LRB): DILATATION AND EVACUATION (N/A)  Anesthesia type: MAC  Patient location: PACU  Post pain: Pain level controlled  Post assessment: Post-op Vital signs reviewed  Last Vitals:  Filed Vitals:   09/02/14 1515  BP: 115/76  Pulse: 86  Temp:   Resp: 14    Post vital signs: Reviewed  Level of consciousness: sedated  Complications: No apparent anesthesia complications

## 2014-09-03 ENCOUNTER — Encounter (HOSPITAL_COMMUNITY): Payer: Self-pay | Admitting: Obstetrics and Gynecology

## 2016-02-23 LAB — OB RESULTS CONSOLE GC/CHLAMYDIA
Chlamydia: NEGATIVE
GC PROBE AMP, GENITAL: NEGATIVE

## 2016-02-23 LAB — OB RESULTS CONSOLE RPR: RPR: NONREACTIVE

## 2016-02-23 LAB — OB RESULTS CONSOLE ABO/RH: RH Type: POSITIVE

## 2016-02-23 LAB — OB RESULTS CONSOLE HEPATITIS B SURFACE ANTIGEN: HEP B S AG: NEGATIVE

## 2016-02-23 LAB — OB RESULTS CONSOLE RUBELLA ANTIBODY, IGM: RUBELLA: IMMUNE

## 2016-02-23 LAB — OB RESULTS CONSOLE HIV ANTIBODY (ROUTINE TESTING): HIV: NONREACTIVE

## 2016-02-23 LAB — OB RESULTS CONSOLE ANTIBODY SCREEN: ANTIBODY SCREEN: NEGATIVE

## 2016-04-01 ENCOUNTER — Inpatient Hospital Stay (HOSPITAL_COMMUNITY)
Admission: AD | Admit: 2016-04-01 | Discharge: 2016-04-01 | Disposition: A | Discharge status: Home / Self Care | Payer: 59 | Source: Ambulatory Visit | Attending: Obstetrics and Gynecology | Admitting: Obstetrics and Gynecology

## 2016-04-01 ENCOUNTER — Encounter (HOSPITAL_COMMUNITY): Payer: Self-pay

## 2016-04-01 DIAGNOSIS — Z9889 Other specified postprocedural states: Secondary | ICD-10-CM | POA: Insufficient documentation

## 2016-04-01 DIAGNOSIS — Z3A12 12 weeks gestation of pregnancy: Secondary | ICD-10-CM | POA: Insufficient documentation

## 2016-04-01 DIAGNOSIS — O219 Vomiting of pregnancy, unspecified: Secondary | ICD-10-CM | POA: Diagnosis not present

## 2016-04-01 DIAGNOSIS — Z888 Allergy status to other drugs, medicaments and biological substances status: Secondary | ICD-10-CM | POA: Diagnosis not present

## 2016-04-01 DIAGNOSIS — Z880 Allergy status to penicillin: Secondary | ICD-10-CM | POA: Diagnosis not present

## 2016-04-01 DIAGNOSIS — O99341 Other mental disorders complicating pregnancy, first trimester: Secondary | ICD-10-CM | POA: Insufficient documentation

## 2016-04-01 DIAGNOSIS — Z79899 Other long term (current) drug therapy: Secondary | ICD-10-CM | POA: Diagnosis not present

## 2016-04-01 LAB — COMPREHENSIVE METABOLIC PANEL
ALBUMIN: 3.7 g/dL (ref 3.5–5.0)
ALK PHOS: 52 U/L (ref 38–126)
ALT: 18 U/L (ref 14–54)
AST: 16 U/L (ref 15–41)
Anion gap: 7 (ref 5–15)
BUN: 7 mg/dL (ref 6–20)
CALCIUM: 9 mg/dL (ref 8.9–10.3)
CO2: 24 mmol/L (ref 22–32)
Chloride: 105 mmol/L (ref 101–111)
Creatinine, Ser: 0.6 mg/dL (ref 0.44–1.00)
GFR calc Af Amer: >60 mL/min (ref >60)
GFR calc non Af Amer: >60 mL/min (ref >60)
GLUCOSE: 97 mg/dL (ref 65–99)
Potassium: 3.9 mmol/L (ref 3.5–5.1)
Sodium: 136 mmol/L (ref 135–145)
Total Bilirubin: 1 mg/dL (ref 0.3–1.2)
Total Protein: 6.5 g/dL (ref 6.5–8.1)

## 2016-04-01 LAB — URINALYSIS, ROUTINE W REFLEX MICROSCOPIC
Bilirubin Urine: NEGATIVE
Glucose, UA: NEGATIVE mg/dL
Ketones, ur: 40 mg/dL — AB
LEUKOCYTES UA: NEGATIVE
Nitrite: NEGATIVE
PROTEIN: NEGATIVE mg/dL
Specific Gravity, Urine: >1.03 — ABNORMAL HIGH (ref 1.005–1.030)
pH: 6 (ref 5.0–8.0)

## 2016-04-01 LAB — URINE MICROSCOPIC-ADD ON

## 2016-04-01 LAB — CBC
HCT: 35.1 % — ABNORMAL LOW (ref 36.0–46.0)
HEMOGLOBIN: 12.3 g/dL (ref 12.0–15.0)
MCH: 29.8 pg (ref 26.0–34.0)
MCHC: 35 g/dL (ref 30.0–36.0)
MCV: 85 fL (ref 78.0–100.0)
Platelets: 233 10*3/uL (ref 150–400)
RBC: 4.13 MIL/uL (ref 3.87–5.11)
RDW: 12.6 % (ref 11.5–15.5)
WBC: 11.5 10*3/uL — ABNORMAL HIGH (ref 4.0–10.5)

## 2016-04-01 LAB — POCT PREGNANCY, URINE: PREG TEST UR: POSITIVE — AB

## 2016-04-01 MED ORDER — ONDANSETRON HCL 40 MG/20ML IJ SOLN
8.0000 mg | Freq: Once | INTRAMUSCULAR | Status: AC
  Administered 2016-04-01: 8 mg via INTRAVENOUS
  Filled 2016-04-01: qty 4

## 2016-04-01 MED ORDER — PROMETHAZINE HCL 12.5 MG PO TABS: 12.5000 mg | ORAL_TABLET | Freq: Four times a day (QID) | ORAL | 0 refills | Status: DC | PRN

## 2016-04-01 MED ORDER — RANITIDINE HCL 150 MG PO TABS: 150.0000 mg | ORAL_TABLET | Freq: Two times a day (BID) | ORAL | 0 refills | Status: DC

## 2016-04-01 MED ORDER — LACTATED RINGERS IV SOLN
INTRAVENOUS | Status: DC
  Administered 2016-04-01: 21:00:00 via INTRAVENOUS

## 2016-04-01 MED ORDER — LACTATED RINGERS IV BOLUS (SEPSIS)
1000.0000 mL | Freq: Once | INTRAVENOUS | Status: AC
  Administered 2016-04-01: 1000 mL via INTRAVENOUS

## 2016-04-01 MED ORDER — FAMOTIDINE IN NACL 20-0.9 MG/50ML-% IV SOLN
20.0000 mg | Freq: Once | INTRAVENOUS | Status: AC
  Administered 2016-04-01: 20 mg via INTRAVENOUS
  Filled 2016-04-01: qty 50

## 2016-04-01 MED ORDER — PROMETHAZINE HCL 25 MG/ML IJ SOLN
12.5000 mg | Freq: Once | INTRAMUSCULAR | Status: AC
  Administered 2016-04-01: 12.5 mg via INTRAVENOUS
  Filled 2016-04-01: qty 1

## 2016-04-01 MED ORDER — M.V.I. ADULT IV INJ
Freq: Once | INTRAVENOUS | Status: AC
  Administered 2016-04-01: 20:00:00 via INTRAVENOUS
  Filled 2016-04-01: qty 10

## 2016-04-01 NOTE — MAU Note (Signed)
Vomiting since last night can't keep anything down, taking Vit B6 and Unisom.

## 2016-04-01 NOTE — Discharge Instructions (Signed)

## 2016-04-01 NOTE — MAU Provider Note (Signed)
History     CSN: [id number]  Arrival date and time: 04/01/16 [id number]   First Provider Initiated Contact with Patient 04/01/16 1852      Chief Complaint  Patient presents with  . Emesis During Pregnancy   [id number] @[redacted]w[redacted]d  by LMP c/o N/V for the last 24 hrs. She is unable to tolerate food and fluids. She denies fever. She reports some loose stools since last night. She denies sick contacts. She has been managing the N/V with Unisom and B6 until last night. She denies VB and pain. Pregnancy has been uncomplicated to date.     OB History    Gravida Para Term Preterm AB Living   3 2 2  0 0 2   SAB TAB Ectopic Multiple Live Births   0 0 0 0 2      Past Medical History:  Diagnosis Date  . Abnormal Pap smear 06/2011   ASCUS  . Depression   . H/O varicella   . Hyperemesis arising during pregnancy 2013    Past Surgical History:  Procedure Laterality Date  . CESAREAN SECTION  04/26/2012   Procedure: CESAREAN SECTION;  Surgeon: Ena Dawley, MD;  Location: [facility];  Service: Obstetrics;  Laterality: N/A;  Primary Cesarean Section [place] @ [phone],  . DILATION AND EVACUATION N/A 09/02/2014   Procedure: DILATATION AND EVACUATION;  Surgeon: Delice Lesch, MD;  Location: [facility];  Service: Gynecology;  Laterality: N/A;  . IRRIGATION AND DEBRIDEMENT ABSCESS  05/29/2012   Procedure: IRRIGATION AND DEBRIDEMENT ABSCESS;  Surgeon: Edward Jolly, MD;  Location: [facility];  Service: General;  Laterality: Right;  . TONSILLECTOMY  1985   age 61  . WISDOM TOOTH EXTRACTION  1996   age 69    Family History  Problem Relation Age of Onset  . Mental illness Mother   . Alcohol abuse Father   . Kidney disease Sister   . Alcohol abuse Sister   . Alcohol abuse Brother     Social History  Substance Use Topics  . Smoking status: Never Smoker  . Smokeless tobacco: Never Used  . Alcohol use Yes     Comment: 3 drinks per week    Allergies:  Allergies  Allergen Reactions  . Amoxicillin  Nausea And Vomiting    Has patient had a PCN reaction causing immediate rash, facial/tongue/throat swelling, SOB or lightheadedness with hypotension: No Has patient had a PCN reaction causing severe rash involving mucus membranes or skin necrosis: No Has patient had a PCN reaction that required hospitalization No Has patient had a PCN reaction occurring within the last 10 years: Yes If all of the above answers are "NO", then may proceed with Cephalosporin use.   . Codeine Nausea Only    Prescriptions Prior to Admission  Medication Sig Dispense Refill Last Dose  . doxylamine, Sleep, (UNISOM) 25 MG tablet Take 25 mg by mouth 2 (two) times daily.   03/31/2016 at Unknown time  . Prenatal Vit-Fe Fumarate-FA (PRENATAL MULTIVITAMIN) TABS Take 1 tablet by mouth daily.   03/31/2016 at Unknown time  . Pyridoxine HCl (B-6 PO) Take 1 tablet by mouth 2 (two) times daily.   03/31/2016 at Unknown time  . ibuprofen (ADVIL,MOTRIN) 600 MG tablet Take 1 tablet (600 mg total) by mouth every 6 (six) hours as needed for mild pain or moderate pain. (Patient not taking: Reported on 04/01/2016) 30 tablet 1 Not Taking at Unknown time  . oxyCODONE-acetaminophen (PERCOCET/ROXICET) 5-325 MG per tablet Take 1-2 tablets  by mouth every 6 (six) hours as needed for severe pain. (Patient not taking: Reported on 04/01/2016) 15 tablet 0 Not Taking at Unknown time    Review of Systems  Constitutional: Negative.   Gastrointestinal: Positive for nausea and vomiting.  Neurological: Negative.    Physical Exam   Blood pressure 124/92, pulse 97, temperature 98.8 F (37.1 C), resp. rate 18, height 5\' 2"  (1.575 m), weight 75.8 kg (167 lb), last menstrual period 01/02/2016, unknown if currently breastfeeding.  Physical Exam  Constitutional: She is oriented to person, place, and time. She appears well-developed and well-nourished. She appears ill.  HENT:  Head: Normocephalic and atraumatic.  Neck: Normal range of motion.   Cardiovascular: Normal rate.   Mild tachy  Respiratory: Effort normal.  GI: Soft. She exhibits no distension and no mass. There is no tenderness. There is no rebound and no guarding.  Musculoskeletal: Normal range of motion.  Neurological: She is alert and oriented to person, place, and time.  Skin: Skin is warm and dry.  Psychiatric: She has a normal mood and affect.  FHT: 168 bpm  Results for orders placed or performed during the hospital encounter of 04/01/16 (from the past 24 hour(s))  Urinalysis, Routine w reflex microscopic (not at [facility])     Status: Abnormal   Collection Time: 04/01/16  6:45 PM  Result Value Ref Range   Color, Urine YELLOW YELLOW   APPearance CLEAR CLEAR   Specific Gravity, Urine >1.030 (H) 1.005 - 1.030   pH 6.0 5.0 - 8.0   Glucose, UA NEGATIVE NEGATIVE mg/dL   Hgb urine dipstick TRACE (A) NEGATIVE   Bilirubin Urine NEGATIVE NEGATIVE   Ketones, ur 40 (A) NEGATIVE mg/dL   Protein, ur NEGATIVE NEGATIVE mg/dL   Nitrite NEGATIVE NEGATIVE   Leukocytes, UA NEGATIVE NEGATIVE  Urine microscopic-add on     Status: Abnormal   Collection Time: 04/01/16  6:45 PM  Result Value Ref Range   Squamous Epithelial / LPF 0-5 (A) NONE SEEN   WBC, UA 0-5 0 - 5 WBC/hpf   RBC / HPF 0-5 0 - 5 RBC/hpf   Bacteria, UA FEW (A) NONE SEEN   Urine-Other MUCOUS PRESENT   Pregnancy, urine POC     Status: Abnormal   Collection Time: 04/01/16  6:51 PM  Result Value Ref Range   Preg Test, Ur POSITIVE (A) NEGATIVE  CBC     Status: Abnormal   Collection Time: 04/01/16  7:11 PM  Result Value Ref Range   WBC 11.5 (H) 4.0 - 10.5 K/uL   RBC 4.13 3.87 - 5.11 MIL/uL   Hemoglobin 12.3 12.0 - 15.0 g/dL   HCT 35.1 (L) 36.0 - 46.0 %   MCV 85.0 78.0 - 100.0 fL   MCH 29.8 26.0 - 34.0 pg   MCHC 35.0 30.0 - 36.0 g/dL   RDW 12.6 11.5 - 15.5 %   Platelets 233 150 - 400 K/uL  Comprehensive metabolic panel     Status: None   Collection Time: 04/01/16  7:11 PM  Result Value Ref Range   Sodium 136  135 - 145 mmol/L   Potassium 3.9 3.5 - 5.1 mmol/L   Chloride 105 101 - 111 mmol/L   CO2 24 22 - 32 mmol/L   Glucose, Bld 97 65 - 99 mg/dL   BUN 7 6 - 20 mg/dL   Creatinine, Ser 0.60 0.44 - 1.00 mg/dL   Calcium 9.0 8.9 - 10.3 mg/dL   Total Protein 6.5 6.5 - 8.1  g/dL   Albumin 3.7 3.5 - 5.0 g/dL   AST 16 15 - 41 U/L   ALT 18 14 - 54 U/L   Alkaline Phosphatase 52 38 - 126 U/L   Total Bilirubin 1.0 0.3 - 1.2 mg/dL   GFR calc non Af Amer >60 >60 mL/min   GFR calc Af Amer >60 >60 mL/min   Anion gap 7 5 - 15    MAU Course  Procedures IV LR 1L bolus Pepcid 20 mg IV x1 Phenergan 12.5 mg IV x1 MTV in LR 1L Zofran 8mg  IV  MDM Labs ordered and reviewed. No improvement of nausea after Phenergan. Report and transfer of care given to Courtany Mcmurphy, NP.  Patient feeling much better following IV fluids and meds. She is tolerating PO fluids at this time.   Assessment and Plan   A:  1. Nausea and vomiting in pregnancy     P:  Discharge home in stable condition Stop Unisom Rx: Phenergan, Zantac Discussed diet options including small, frequent meals Discussed adding Zofran If the above regimen is not enough  Patient to follow up with [facility] next or sooner if needed Return precautions discussed   Lezlie Lye, NP 04/01/2016 11:21 PM

## 2016-08-01 NOTE — L&D Delivery Note (Signed)
Delivery Note At 4:45 PM a viable female, "Lissa Merlin", was delivered via VBAC, Spontaneous (Presentation:LOA).  APGAR: 7, 7, 9 weight  TBA   Placenta status: Spontaneous and intact.  Cord: WNL, with the following complications: None  Cord pH: NA  Patient made rapid progress after AROM at 3:50, with patient complete at 4:40 and delivery at 4:45.  Baby had rapid descent from -2 station to delivery.  Anesthesia:  Local for repair Episiotomy: None Lacerations: 2nd degree;Perineal Suture Repair: 3.0 chromic Est. Blood Loss (mL): 300  Mom to postpartum.  Baby to Couplet care / Skin to Skin. Patient desires inpatient circumcision for baby.  She is undecided regarding birth control.  Placenta to path due to gestational hypertension  Will repeat PI[facility] labs in the am.  Treat BP for parameters.  Will observe BP pp to determine if oral meds are needed.  Donnel Saxon 09/28/2016, 5:31 PM

## 2016-09-05 LAB — OB RESULTS CONSOLE GBS: GBS: POSITIVE

## 2016-09-27 ENCOUNTER — Telehealth (HOSPITAL_COMMUNITY): Payer: Self-pay | Admitting: *Deleted

## 2016-09-27 ENCOUNTER — Other Ambulatory Visit: Payer: Self-pay | Admitting: Obstetrics and Gynecology

## 2016-09-27 ENCOUNTER — Encounter (HOSPITAL_COMMUNITY): Payer: Self-pay | Admitting: *Deleted

## 2016-09-27 NOTE — Telephone Encounter (Signed)
Preadmission screen  

## 2016-09-27 NOTE — H&P (Signed)
Susan Zamora is a 36 y.o. female, [id number] at 38.3 weeks, presenting for induction of labor for gestational hypertension.  Pt was seen in office today.  Denied headache or blurred vision.  Toxemia panel drawn with UPC sent.  Hgb9.9 plt 220, AST 57, ALT 81 LDH 145, UA 5.4.  UPC pending.  Last 24 hour urine was on 2/21 result of 185.  [place] and BPP today wnl.  Hx of prior LTCS with successful vbac.  Hx of postpartum depression x2.  Pt requested to be started on Zoloft at 35 weeks.  Currently on 50 mg.  Pt has hx of a postive MRSA nasal swab in 2016.    Patient Active Problem List   Diagnosis Date Noted  . Depression 09/01/2014  . Retained products of conception following SAB 09/01/2014  . Allergy to amoxicillin 09/01/2014    History of present pregnancy: Patient entered care at 7.2 weeks.   EDC of 10/08/2016 was established by LMP.   Anatomy scan:  20 weeks, with normal findings and an anterior placenta.   Additional [place] evaluations:  At 24 weeks for completion of anatomy scan. Today BPP8/8 with normal AFI Fetal weight of 9.1  Pelvis proven to 8-13 Significant prenatal events:    Last evaluation:  09/27/2016  OB History    Gravida Para Term Preterm AB Living   4 2 2  0 1 2   SAB TAB Ectopic Multiple Live Births   1 0 0 0 2     Past Medical History:  Diagnosis Date  . Abnormal Pap smear 06/2011   ASCUS  . Depression   . H/O varicella   . Hyperemesis arising during pregnancy 2013   Past Surgical History:  Procedure Laterality Date  . CESAREAN SECTION  04/26/2012   Procedure: CESAREAN SECTION;  Surgeon: Ena Dawley, MD;  Location: [facility];  Service: Obstetrics;  Laterality: N/A;  Primary Cesarean Section [place] @ [phone],  . DILATION AND EVACUATION N/A 09/02/2014   Procedure: DILATATION AND EVACUATION;  Surgeon: Delice Lesch, MD;  Location: [facility];  Service: Gynecology;  Laterality: N/A;  . IRRIGATION AND DEBRIDEMENT ABSCESS  05/29/2012   Procedure: IRRIGATION AND  DEBRIDEMENT ABSCESS;  Surgeon: Edward Jolly, MD;  Location: [facility];  Service: General;  Laterality: Right;  . TONSILLECTOMY  1985   age 72  . WISDOM TOOTH EXTRACTION  1996   age 34   Family History: family history includes Alcohol abuse in her brother, father, and sister; Kidney disease in her sister; Mental illness in her mother. Social History:  reports that she has never smoked. She has never used smokeless tobacco. She reports that she drinks alcohol. She reports that she does not use drugs.   Prenatal Transfer Tool  Maternal Diabetes: No Genetic Screening: Declined Maternal Ultrasounds/Referrals: Normal Fetal Ultrasounds or other Referrals:  None Maternal Substance Abuse:  No Significant Maternal Medications:  None Significant Maternal Lab Results: Lab values include: Group B Strep positive  TDAP declined Flu declined  ROS:  Reviewed 10 system and all negative except as stated above    Allergies  Allergen Reactions  . Amoxicillin Nausea And Vomiting    Has patient had a PCN reaction causing immediate rash, facial/tongue/throat swelling, SOB or lightheadedness with hypotension: No Has patient had a PCN reaction causing severe rash involving mucus membranes or skin necrosis: No Has patient had a PCN reaction that required hospitalization No Has patient had a PCN reaction occurring within the last 10  years: Yes If all of the above answers are "NO", then may proceed with Cephalosporin use.   . Codeine Nausea Only       Last menstrual period 01/02/2016, unknown if currently breastfeeding.  Chest clear Heart RRR without murmur Abd gravid, NT, FH appropriate for gestational age Pelvic: 2/50/-2 Cervical ripening with foley bulb discussed with pt. Foley bulb inserted without difficulty 40cc saline inserted into balloon. Ext: 1+ edema, negative clonus  FHR: Category 1 UCs:  2 in 10 minutes  Prenatal labs: ABO, Rh: O/Positive/-- (07/25 0000) Antibody: Negative  (07/25 0000) Rubella:  Immune RPR: Nonreactive (07/25 0000)  HBsAg: Negative (07/25 0000)  HIV: Non-reactive (07/25 0000)  GBS: Positive (02/05 0000) Pap:  negative GC: negative Chlamydia: negative Genetic screenings: Declined Glucola:  107 Other:  MRSA nasal swab 2016 Hgb 13.0 at NOB, 9.9 at 28 weeks       Assessment/Plan: IUP at 38.3 induction for gestational hypertension Cat 1 strip Previous LTCS, with successful vbac.  Proven 8-13.  Pt aware of risk of tolac and agrees with proceeding. GBS positive Depression  Plan: Admit to [place] per consult with Dr. Charlesetta Garibaldi Routine [facility] orders Pain med/epidural prn PCN G for GBS prophylaxis PCN, has sensitivity of amoxicillin nausea has had PCN in past.  Pleas Koch ProtheroCNM, MSN 09/27/2016, 6:55 PM

## 2016-09-28 ENCOUNTER — Encounter (HOSPITAL_COMMUNITY): Payer: Self-pay

## 2016-09-28 ENCOUNTER — Inpatient Hospital Stay (HOSPITAL_COMMUNITY)
Admission: RE | Admit: 2016-09-28 | Discharge: 2016-09-30 | DRG: 775 | Disposition: A | Discharge status: Home / Self Care | Payer: 59 | Source: Ambulatory Visit | Attending: Obstetrics and Gynecology | Admitting: Obstetrics and Gynecology

## 2016-09-28 DIAGNOSIS — O3663X0 Maternal care for excessive fetal growth, third trimester, not applicable or unspecified: Secondary | ICD-10-CM | POA: Diagnosis present

## 2016-09-28 DIAGNOSIS — O119 Pre-existing hypertension with pre-eclampsia, unspecified trimester: Secondary | ICD-10-CM | POA: Diagnosis present

## 2016-09-28 DIAGNOSIS — D649 Anemia, unspecified: Secondary | ICD-10-CM | POA: Diagnosis present

## 2016-09-28 DIAGNOSIS — O99344 Other mental disorders complicating childbirth: Secondary | ICD-10-CM | POA: Diagnosis present

## 2016-09-28 DIAGNOSIS — O1404 Mild to moderate pre-eclampsia, complicating childbirth: Principal | ICD-10-CM | POA: Diagnosis present

## 2016-09-28 DIAGNOSIS — O34211 Maternal care for low transverse scar from previous cesarean delivery: Secondary | ICD-10-CM | POA: Diagnosis present

## 2016-09-28 DIAGNOSIS — O99824 Streptococcus B carrier state complicating childbirth: Secondary | ICD-10-CM | POA: Diagnosis present

## 2016-09-28 DIAGNOSIS — F329 Major depressive disorder, single episode, unspecified: Secondary | ICD-10-CM | POA: Diagnosis present

## 2016-09-28 DIAGNOSIS — O9902 Anemia complicating childbirth: Secondary | ICD-10-CM | POA: Diagnosis present

## 2016-09-28 DIAGNOSIS — Z8614 Personal history of Methicillin resistant Staphylococcus aureus infection: Secondary | ICD-10-CM | POA: Diagnosis not present

## 2016-09-28 DIAGNOSIS — O9962 Diseases of the digestive system complicating childbirth: Secondary | ICD-10-CM | POA: Diagnosis present

## 2016-09-28 DIAGNOSIS — O163 Unspecified maternal hypertension, third trimester: Secondary | ICD-10-CM | POA: Diagnosis present

## 2016-09-28 DIAGNOSIS — Z880 Allergy status to penicillin: Secondary | ICD-10-CM

## 2016-09-28 DIAGNOSIS — O34219 Maternal care for unspecified type scar from previous cesarean delivery: Secondary | ICD-10-CM | POA: Diagnosis not present

## 2016-09-28 DIAGNOSIS — Z3A38 38 weeks gestation of pregnancy: Secondary | ICD-10-CM

## 2016-09-28 DIAGNOSIS — O134 Gestational [pregnancy-induced] hypertension without significant proteinuria, complicating childbirth: Secondary | ICD-10-CM | POA: Diagnosis present

## 2016-09-28 DIAGNOSIS — O139 Gestational [pregnancy-induced] hypertension without significant proteinuria, unspecified trimester: Secondary | ICD-10-CM | POA: Diagnosis present

## 2016-09-28 DIAGNOSIS — K219 Gastro-esophageal reflux disease without esophagitis: Secondary | ICD-10-CM | POA: Diagnosis present

## 2016-09-28 HISTORY — DX: Gestational (pregnancy-induced) hypertension without significant proteinuria, unspecified trimester: O13.9

## 2016-09-28 LAB — MRSA PCR SCREENING: MRSA by PCR: POSITIVE — AB

## 2016-09-28 LAB — CBC
HCT: 30.9 % — ABNORMAL LOW (ref 36.0–46.0)
HCT: 30.6 % — ABNORMAL LOW (ref 36.0–46.0)
HEMOGLOBIN: 10 g/dL — AB (ref 12.0–15.0)
Hemoglobin: 10.1 g/dL — ABNORMAL LOW (ref 12.0–15.0)
MCH: 26 pg (ref 26.0–34.0)
MCH: 26 pg (ref 26.0–34.0)
MCHC: 32.4 g/dL (ref 30.0–36.0)
MCHC: 33 g/dL (ref 30.0–36.0)
MCV: 80.3 fL (ref 78.0–100.0)
MCV: 78.9 fL (ref 78.0–100.0)
PLATELETS: 209 10*3/uL (ref 150–400)
Platelets: 211 10*3/uL (ref 150–400)
RBC: 3.85 MIL/uL — ABNORMAL LOW (ref 3.87–5.11)
RBC: 3.88 MIL/uL (ref 3.87–5.11)
RDW: 13.9 % (ref 11.5–15.5)
RDW: 14 % (ref 11.5–15.5)
WBC: 8.1 10*3/uL (ref 4.0–10.5)
WBC: 8 10*3/uL (ref 4.0–10.5)

## 2016-09-28 LAB — COMPREHENSIVE METABOLIC PANEL
ALK PHOS: 165 U/L — AB (ref 38–126)
ALT: 88 U/L — ABNORMAL HIGH (ref 14–54)
ANION GAP: 10 (ref 5–15)
AST: 65 U/L — ABNORMAL HIGH (ref 15–41)
Albumin: 2.7 g/dL — ABNORMAL LOW (ref 3.5–5.0)
BUN: 6 mg/dL (ref 6–20)
CALCIUM: 8.7 mg/dL — AB (ref 8.9–10.3)
CHLORIDE: 103 mmol/L (ref 101–111)
CO2: 22 mmol/L (ref 22–32)
Creatinine, Ser: 0.8 mg/dL (ref 0.44–1.00)
GFR calc non Af Amer: >60 mL/min (ref >60)
Glucose, Bld: 92 mg/dL (ref 65–99)
Potassium: 4.1 mmol/L (ref 3.5–5.1)
SODIUM: 135 mmol/L (ref 135–145)
Total Bilirubin: 0.8 mg/dL (ref 0.3–1.2)
Total Protein: 6.3 g/dL — ABNORMAL LOW (ref 6.5–8.1)

## 2016-09-28 LAB — PROTEIN / CREATININE RATIO, URINE
CREATININE, URINE: 40 mg/dL
Protein Creatinine Ratio: 0.15 mg/mg{Cre} (ref 0.00–0.15)
Total Protein, Urine: 6 mg/dL

## 2016-09-28 LAB — TYPE AND SCREEN
ABO/RH(D): O POS
ANTIBODY SCREEN: NEGATIVE

## 2016-09-28 LAB — LACTATE DEHYDROGENASE: LDH: 138 U/L (ref 98–192)

## 2016-09-28 LAB — URIC ACID: URIC ACID, SERUM: 5.1 mg/dL (ref 2.3–6.6)

## 2016-09-28 MED ORDER — EPHEDRINE 5 MG/ML INJ: 10.0000 mg | INTRAVENOUS | Status: DC | PRN

## 2016-09-28 MED ORDER — LACTATED RINGERS IV SOLN: 500.0000 mL | INTRAVENOUS | Status: DC | PRN

## 2016-09-28 MED ORDER — ONDANSETRON HCL 4 MG/2ML IJ SOLN: 4.0000 mg | Freq: Four times a day (QID) | INTRAMUSCULAR | Status: DC | PRN

## 2016-09-28 MED ORDER — DIBUCAINE 1 % RE OINT: 1.0000 "application " | TOPICAL_OINTMENT | RECTAL | Status: DC | PRN

## 2016-09-28 MED ORDER — OXYCODONE HCL 5 MG PO TABS: 10.0000 mg | ORAL_TABLET | ORAL | Status: DC | PRN

## 2016-09-28 MED ORDER — LIDOCAINE HCL (PF) 1 % IJ SOLN
30.0000 mL | INTRAMUSCULAR | Status: AC | PRN
  Administered 2016-09-28: 30 mL via SUBCUTANEOUS
  Filled 2016-09-28: qty 30

## 2016-09-28 MED ORDER — DIPHENHYDRAMINE HCL 50 MG/ML IJ SOLN: 12.5000 mg | INTRAMUSCULAR | Status: DC | PRN

## 2016-09-28 MED ORDER — ACETAMINOPHEN 325 MG PO TABS: 650.0000 mg | ORAL_TABLET | ORAL | Status: DC | PRN

## 2016-09-28 MED ORDER — MAGNESIUM SULFATE 50 % IJ SOLN
2.0000 g/h | INTRAMUSCULAR | Status: AC
  Administered 2016-09-28 – 2016-09-29 (×2): 2 g/h via INTRAVENOUS
  Filled 2016-09-28 (×2): qty 80

## 2016-09-28 MED ORDER — COCONUT OIL OIL: 1.0000 "application " | TOPICAL_OIL | Status: DC | PRN

## 2016-09-28 MED ORDER — OXYCODONE HCL 5 MG PO TABS
5.0000 mg | ORAL_TABLET | ORAL | Status: DC | PRN
  Administered 2016-09-28: 5 mg via ORAL
  Filled 2016-09-28: qty 1

## 2016-09-28 MED ORDER — PHENYLEPHRINE 40 MCG/ML (10ML) SYRINGE FOR IV PUSH (FOR BLOOD PRESSURE SUPPORT): 80.0000 ug | PREFILLED_SYRINGE | INTRAVENOUS | Status: DC | PRN

## 2016-09-28 MED ORDER — OXYTOCIN 40 UNITS IN LACTATED RINGERS INFUSION - SIMPLE MED
1.0000 m[IU]/min | INTRAVENOUS | Status: DC
  Administered 2016-09-28: 1 m[IU]/min via INTRAVENOUS

## 2016-09-28 MED ORDER — PENICILLIN G POTASSIUM 5000000 UNITS IJ SOLR
5.0000 10*6.[IU] | Freq: Once | INTRAVENOUS | Status: AC
  Administered 2016-09-28: 5 10*6.[IU] via INTRAVENOUS
  Filled 2016-09-28: qty 5

## 2016-09-28 MED ORDER — SIMETHICONE 80 MG PO CHEW: 80.0000 mg | CHEWABLE_TABLET | ORAL | Status: DC | PRN

## 2016-09-28 MED ORDER — PENICILLIN G POTASSIUM 20000000 UNITS IJ SOLR
3.0000 10*6.[IU] | INTRAVENOUS | Status: DC
  Filled 2016-09-28 (×2): qty 3

## 2016-09-28 MED ORDER — ONDANSETRON HCL 4 MG/2ML IJ SOLN: 4.0000 mg | INTRAMUSCULAR | Status: DC | PRN

## 2016-09-28 MED ORDER — SENNOSIDES-DOCUSATE SODIUM 8.6-50 MG PO TABS
2.0000 | ORAL_TABLET | ORAL | Status: DC
  Administered 2016-09-28 – 2016-09-30 (×2): 2 via ORAL
  Filled 2016-09-28 (×2): qty 2

## 2016-09-28 MED ORDER — WITCH HAZEL-GLYCERIN EX PADS: 1.0000 "application " | MEDICATED_PAD | CUTANEOUS | Status: DC | PRN

## 2016-09-28 MED ORDER — DIPHENHYDRAMINE HCL 25 MG PO CAPS: 25.0000 mg | ORAL_CAPSULE | Freq: Four times a day (QID) | ORAL | Status: DC | PRN

## 2016-09-28 MED ORDER — HYDRALAZINE HCL 20 MG/ML IJ SOLN: 10.0000 mg | Freq: Once | INTRAMUSCULAR | Status: DC | PRN

## 2016-09-28 MED ORDER — IBUPROFEN 600 MG PO TABS
600.0000 mg | ORAL_TABLET | Freq: Four times a day (QID) | ORAL | Status: DC
  Administered 2016-09-28 – 2016-09-30 (×7): 600 mg via ORAL
  Filled 2016-09-28 (×7): qty 1

## 2016-09-28 MED ORDER — LABETALOL HCL 5 MG/ML IV SOLN: 20.0000 mg | INTRAVENOUS | Status: DC | PRN

## 2016-09-28 MED ORDER — FENTANYL CITRATE (PF) 100 MCG/2ML IJ SOLN
100.0000 ug | INTRAMUSCULAR | Status: DC | PRN
  Administered 2016-09-28: 100 ug via INTRAVENOUS
  Filled 2016-09-28: qty 2

## 2016-09-28 MED ORDER — PANTOPRAZOLE SODIUM 40 MG IV SOLR
40.0000 mg | Freq: Once | INTRAVENOUS | Status: AC
  Administered 2016-09-28: 40 mg via INTRAVENOUS
  Filled 2016-09-28: qty 40

## 2016-09-28 MED ORDER — LACTATED RINGERS IV SOLN: 500.0000 mL | Freq: Once | INTRAVENOUS | Status: DC

## 2016-09-28 MED ORDER — TETANUS-DIPHTH-ACELL PERTUSSIS 5-2.5-18.5 LF-MCG/0.5 IM SUSP
0.5000 mL | Freq: Once | INTRAMUSCULAR | Status: AC
  Administered 2016-09-29: 0.5 mL via INTRAMUSCULAR
  Filled 2016-09-28: qty 0.5

## 2016-09-28 MED ORDER — IBUPROFEN 600 MG PO TABS
600.0000 mg | ORAL_TABLET | Freq: Four times a day (QID) | ORAL | Status: DC
Start: 2016-09-29 — End: 2016-09-28

## 2016-09-28 MED ORDER — BENZOCAINE-MENTHOL 20-0.5 % EX AERO: 1.0000 "application " | INHALATION_SPRAY | CUTANEOUS | Status: DC | PRN

## 2016-09-28 MED ORDER — PRENATAL MULTIVITAMIN CH
1.0000 | ORAL_TABLET | Freq: Every day | ORAL | Status: DC
  Administered 2016-09-29: 1 via ORAL
  Filled 2016-09-28: qty 1

## 2016-09-28 MED ORDER — LACTATED RINGERS IV SOLN
INTRAVENOUS | Status: DC
  Administered 2016-09-28 – 2016-09-29 (×2): via INTRAVENOUS

## 2016-09-28 MED ORDER — FENTANYL CITRATE (PF) 100 MCG/2ML IJ SOLN: 50.0000 ug | INTRAMUSCULAR | Status: DC | PRN

## 2016-09-28 MED ORDER — ONDANSETRON HCL 4 MG PO TABS: 4.0000 mg | ORAL_TABLET | ORAL | Status: DC | PRN

## 2016-09-28 MED ORDER — SENNOSIDES-DOCUSATE SODIUM 8.6-50 MG PO TABS: 2.0000 | ORAL_TABLET | ORAL | Status: DC

## 2016-09-28 MED ORDER — ZOLPIDEM TARTRATE 5 MG PO TABS: 5.0000 mg | ORAL_TABLET | Freq: Every evening | ORAL | Status: DC | PRN

## 2016-09-28 MED ORDER — OXYTOCIN 40 UNITS IN LACTATED RINGERS INFUSION - SIMPLE MED: 2.5000 [IU]/h | INTRAVENOUS | Status: DC

## 2016-09-28 MED ORDER — BENZOCAINE-MENTHOL 20-0.5 % EX AERO
1.0000 "application " | INHALATION_SPRAY | CUTANEOUS | Status: DC | PRN
  Administered 2016-09-28 (×2): 1 via TOPICAL
  Filled 2016-09-28 (×2): qty 56

## 2016-09-28 MED ORDER — FENTANYL 2.5 MCG/ML BUPIVACAINE 1/10 % EPIDURAL INFUSION (WH - ANES)
INTRAMUSCULAR | Status: AC
  Filled 2016-09-28: qty 100

## 2016-09-28 MED ORDER — SERTRALINE HCL 50 MG PO TABS
50.0000 mg | ORAL_TABLET | Freq: Every day | ORAL | Status: DC
  Administered 2016-09-28 – 2016-09-30 (×3): 50 mg via ORAL
  Filled 2016-09-28 (×4): qty 1

## 2016-09-28 MED ORDER — LACTATED RINGERS IV SOLN
INTRAVENOUS | Status: DC
  Administered 2016-09-28: 01:00:00 via INTRAVENOUS

## 2016-09-28 MED ORDER — MUPIROCIN 2 % EX OINT
1.0000 "application " | TOPICAL_OINTMENT | Freq: Two times a day (BID) | CUTANEOUS | Status: DC
  Administered 2016-09-28 – 2016-09-29 (×3): 1 via NASAL
  Filled 2016-09-28: qty 22

## 2016-09-28 MED ORDER — MAGNESIUM SULFATE BOLUS VIA INFUSION
4.0000 g | Freq: Once | INTRAVENOUS | Status: AC
  Administered 2016-09-28: 4 g via INTRAVENOUS
  Filled 2016-09-28: qty 500

## 2016-09-28 MED ORDER — HYDRALAZINE HCL 20 MG/ML IJ SOLN
10.0000 mg | Freq: Once | INTRAMUSCULAR | Status: DC | PRN
Start: 2016-09-28 — End: 2016-09-29

## 2016-09-28 MED ORDER — FLEET ENEMA 7-19 GM/118ML RE ENEM: 1.0000 | ENEMA | RECTAL | Status: DC | PRN

## 2016-09-28 MED ORDER — OXYTOCIN BOLUS FROM INFUSION
500.0000 mL | Freq: Once | INTRAVENOUS | Status: AC
  Administered 2016-09-28: 500 mL via INTRAVENOUS

## 2016-09-28 MED ORDER — TERBUTALINE SULFATE 1 MG/ML IJ SOLN: 0.2500 mg | Freq: Once | INTRAMUSCULAR | Status: DC | PRN

## 2016-09-28 MED ORDER — FAMOTIDINE 20 MG PO TABS
20.0000 mg | ORAL_TABLET | Freq: Two times a day (BID) | ORAL | Status: DC
  Administered 2016-09-28 – 2016-09-30 (×4): 20 mg via ORAL
  Filled 2016-09-28 (×4): qty 1

## 2016-09-28 MED ORDER — PRENATAL MULTIVITAMIN CH: 1.0000 | ORAL_TABLET | Freq: Every day | ORAL | Status: DC

## 2016-09-28 MED ORDER — PENICILLIN G POT IN DEXTROSE 60000 UNIT/ML IV SOLN
3.0000 10*6.[IU] | INTRAVENOUS | Status: DC
  Administered 2016-09-28 (×2): 3 10*6.[IU] via INTRAVENOUS
  Filled 2016-09-28 (×5): qty 50

## 2016-09-28 MED ORDER — DEXTROSE 5 % IV SOLN
5.0000 10*6.[IU] | Freq: Once | INTRAVENOUS | Status: DC
  Administered 2016-09-28: 5 10*6.[IU] via INTRAVENOUS
  Filled 2016-09-28: qty 5

## 2016-09-28 MED ORDER — CHLORHEXIDINE GLUCONATE CLOTH 2 % EX PADS
6.0000 | MEDICATED_PAD | Freq: Every day | CUTANEOUS | Status: DC
  Administered 2016-09-29 – 2016-09-30 (×2): 6 via TOPICAL

## 2016-09-28 MED ORDER — OXYTOCIN 40 UNITS IN LACTATED RINGERS INFUSION - SIMPLE MED
1.0000 m[IU]/min | INTRAVENOUS | Status: DC
  Filled 2016-09-28: qty 1000

## 2016-09-28 MED ORDER — SOD CITRATE-CITRIC ACID 500-334 MG/5ML PO SOLN: 30.0000 mL | ORAL | Status: DC | PRN

## 2016-09-28 MED ORDER — CHLORHEXIDINE GLUCONATE CLOTH 2 % EX PADS: 6.0000 | MEDICATED_PAD | Freq: Every day | CUTANEOUS | Status: DC

## 2016-09-28 MED ORDER — TETANUS-DIPHTH-ACELL PERTUSSIS 5-2.5-18.5 LF-MCG/0.5 IM SUSP: 0.5000 mL | Freq: Once | INTRAMUSCULAR | Status: DC

## 2016-09-28 MED ORDER — MUPIROCIN 2 % EX OINT
1.0000 "application " | TOPICAL_OINTMENT | Freq: Two times a day (BID) | CUTANEOUS | Status: DC
  Administered 2016-09-28: 1 via NASAL
  Filled 2016-09-28: qty 22

## 2016-09-28 MED ORDER — PHENYLEPHRINE 40 MCG/ML (10ML) SYRINGE FOR IV PUSH (FOR BLOOD PRESSURE SUPPORT)
PREFILLED_SYRINGE | INTRAVENOUS | Status: AC
  Filled 2016-09-28: qty 20

## 2016-09-28 NOTE — Progress Notes (Signed)
  Subjective: Breathing with UCs, but coping very well.  Husband at bedside.  Objective: BP (!) 147/107   Pulse 90   Temp 98.4 F (36.9 C) (Oral)   Resp 18   Ht 5' 2.5" (1.588 m)   Wt 78.5 kg (173 lb)   LMP 01/02/2016   BMI 31.14 kg/m  No intake/output data recorded. No intake/output data recorded.   Vitals:   09/28/16 1131 09/28/16 1201 09/28/16 1227 09/28/16 1234  BP: (!) 132/93 117/79  (!) 147/107  Pulse: 97 90    Resp:  18    Temp:   98.4 F (36.9 C)   TempSrc:   Oral   Weight:      Height:        FHT: Category 1 UC:   regular, every 2 minutes SVE:   Dilation: 6.5 Effacement (%): 70, 80 Station: -3, -2 Exam by:: Windy Kalata CNM  Cervix still posterior, BBOW. AROM deferred until vtx descends. Pitocin at 8 mu/min--decreased to 7 mu/min.  Assessment:  Induction of labor, gestational hypertension GBS positive Previous C/S, subsequent VBAC LGA fetus  Plan: Continue current care Dr. Mancel Bale updated.  Donnel Saxon CNM 09/28/2016, 1:19 PM

## 2016-09-28 NOTE — Progress Notes (Signed)
MOB was referred for history of depression/anxiety. * Referral screened out by Clinical Social Worker because none of the following criteria appear to apply: ~ History of anxiety/depression during this pregnancy, or of post-partum depression. ~ Diagnosis of anxiety and/or depression within last 3 years OR * MOB's symptoms currently being treated with medication and/or therapy. Please contact the Clinical Social Worker if needs arise, or if MOB requests.  MOB has Rx for Zoloft.   

## 2016-09-28 NOTE — Progress Notes (Signed)
Per dayshift RN Abelino Derrick RN, report from L&D RN that infectious disease called regarding pt's history of MRSA and +MRSA swab this admission. Per infectious disease, pt does not need contact precautions due to not having an open wound.

## 2016-09-28 NOTE — Progress Notes (Addendum)
S: Review of BPs and labs. Pt denies h/a, visual changes, epigastric pain or difficulty breathing. Pain controlled.  O:  Today's Vitals   09/28/16 1813 09/28/16 1840 09/28/16 1940 09/28/16 2116  BP: 140/88 (!) 145/93 (!) 145/104 (!) 143/93  Pulse: 69 79 80 80  Resp:  18 18   Temp:  97.7 F (36.5 C) 97.9 F (36.6 C)   TempSrc:  Oral Oral   SpO2:      Weight:      Height:      PainSc:  0-No pain 7     BPs systolic: 123XX123 and diastolic Q000111Q. BPs after delivery = 140-152/88-104  Results for orders placed or performed during the hospital encounter of 09/28/16 (from the past 24 hour(s))  MRSA PCR Screening     Status: Abnormal   Collection Time: 09/28/16 12:48 AM  Result Value Ref Range   MRSA by PCR POSITIVE (A) NEGATIVE  CBC     Status: Abnormal   Collection Time: 09/28/16  1:05 AM  Result Value Ref Range   WBC 8.1 4.0 - 10.5 K/uL   RBC 3.85 (L) 3.87 - 5.11 MIL/uL   Hemoglobin 10.0 (L) 12.0 - 15.0 g/dL   HCT 30.9 (L) 36.0 - 46.0 %   MCV 80.3 78.0 - 100.0 fL   MCH 26.0 26.0 - 34.0 pg   MCHC 32.4 30.0 - 36.0 g/dL   RDW 13.9 11.5 - 15.5 %   Platelets 211 150 - 400 K/uL  Type and screen     Status: None   Collection Time: 09/28/16  1:05 AM  Result Value Ref Range   ABO/RH(D) O POS    Antibody Screen NEG    Sample Expiration 10/01/2016   Comprehensive metabolic panel     Status: Abnormal   Collection Time: 09/28/16  7:38 AM  Result Value Ref Range   Sodium 135 135 - 145 mmol/L   Potassium 4.1 3.5 - 5.1 mmol/L   Chloride 103 101 - 111 mmol/L   CO2 22 22 - 32 mmol/L   Glucose, Bld 92 65 - 99 mg/dL   BUN 6 6 - 20 mg/dL   Creatinine, Ser 0.80 0.44 - 1.00 mg/dL   Calcium 8.7 (L) 8.9 - 10.3 mg/dL   Total Protein 6.3 (L) 6.5 - 8.1 g/dL   Albumin 2.7 (L) 3.5 - 5.0 g/dL   AST 65 (H) 15 - 41 U/L   ALT 88 (H) 14 - 54 U/L   Alkaline Phosphatase 165 (H) 38 - 126 U/L   Total Bilirubin 0.8 0.3 - 1.2 mg/dL   GFR calc non Af Amer >60 >60 mL/min   GFR calc Af Amer >60 >60 mL/min    Anion gap 10 5 - 15  Lactate dehydrogenase     Status: None   Collection Time: 09/28/16  7:38 AM  Result Value Ref Range   LDH 138 98 - 192 U/L  Uric acid     Status: None   Collection Time: 09/28/16  7:38 AM  Result Value Ref Range   Uric Acid, Serum 5.1 2.3 - 6.6 mg/dL  CBC     Status: Abnormal   Collection Time: 09/28/16  7:38 AM  Result Value Ref Range   WBC 8.0 4.0 - 10.5 K/uL   RBC 3.88 3.87 - 5.11 MIL/uL   Hemoglobin 10.1 (L) 12.0 - 15.0 g/dL   HCT 30.6 (L) 36.0 - 46.0 %   MCV 78.9 78.0 - 100.0 fL   MCH 26.0 26.0 -  34.0 pg   MCHC 33.0 30.0 - 36.0 g/dL   RDW 14.0 11.5 - 15.5 %   Platelets 209 150 - 400 K/uL  Protein / creatinine ratio, urine     Status: None   Collection Time: 09/28/16  9:00 AM  Result Value Ref Range   Creatinine, Urine 40.00 mg/dL   Total Protein, Urine 6 mg/dL   Protein Creatinine Ratio 0.15 0.00 - 0.15 mg/mg[Cre]   Gen: NAD, ambulating, eating and bonding well w/ infant. Spouse at bedside, supportive Lungs: CTAB CV: RRR w/o M/R/G Abd: soft, appropriately tender, fundus firm Lochia: moderate 3+ DTRs bilaterally, no clonus appreciated, trace swelling Neg Homan's bilaterally  A: 36 yo G4 now [id number] s/p successful VBAC, now preE w/o severe features. MRSA positive; no active lesions.  P: Begin Magnesium Sulfate x 24 hrs per consultation w/ Dr. Mancel Bale. Pt in agreement. Repeat preE labs in am as scheduled - will add Magnesium level. Continue routine postpartum care.   Farrel Gordon, CNM 09/28/16, 9:36 PM

## 2016-09-28 NOTE — Progress Notes (Signed)
Subjective: Sitting on side of bed--not uncomfortable.  Denies HA, visual sx, or epigastric pain.    Objective: BP (!) 123/101   Pulse (!) 111   Temp 97.4 F (36.3 C) (Oral)   Resp 18   Ht 5' 2.5" (1.588 m)   Wt 78.5 kg (173 lb)   LMP 01/02/2016   BMI 31.14 kg/m  No intake/output data recorded. No intake/output data recorded.   Vitals:   09/28/16 1000 09/28/16 1001 09/28/16 1002 09/28/16 1003  BP:  (!) 123/101 (!) 123/101 (!) 123/101  Pulse:  (!) 111 (!) 111 (!) 111  Resp: 18     Temp:      TempSrc:      Weight:      Height:        Results for orders placed or performed during the hospital encounter of 09/28/16 (from the past 24 hour(s))  MRSA PCR Screening     Status: Abnormal   Collection Time: 09/28/16 12:48 AM  Result Value Ref Range   MRSA by PCR POSITIVE (A) NEGATIVE  CBC     Status: Abnormal   Collection Time: 09/28/16  1:05 AM  Result Value Ref Range   WBC 8.1 4.0 - 10.5 K/uL   RBC 3.85 (L) 3.87 - 5.11 MIL/uL   Hemoglobin 10.0 (L) 12.0 - 15.0 g/dL   HCT 30.9 (L) 36.0 - 46.0 %   MCV 80.3 78.0 - 100.0 fL   MCH 26.0 26.0 - 34.0 pg   MCHC 32.4 30.0 - 36.0 g/dL   RDW 13.9 11.5 - 15.5 %   Platelets 211 150 - 400 K/uL  Type and screen     Status: None   Collection Time: 09/28/16  1:05 AM  Result Value Ref Range   ABO/RH(D) O POS    Antibody Screen NEG    Sample Expiration 10/01/2016   Comprehensive metabolic panel     Status: Abnormal   Collection Time: 09/28/16  7:38 AM  Result Value Ref Range   Sodium 135 135 - 145 mmol/L   Potassium 4.1 3.5 - 5.1 mmol/L   Chloride 103 101 - 111 mmol/L   CO2 22 22 - 32 mmol/L   Glucose, Bld 92 65 - 99 mg/dL   BUN 6 6 - 20 mg/dL   Creatinine, Ser 0.80 0.44 - 1.00 mg/dL   Calcium 8.7 (L) 8.9 - 10.3 mg/dL   Total Protein 6.3 (L) 6.5 - 8.1 g/dL   Albumin 2.7 (L) 3.5 - 5.0 g/dL   AST 65 (H) 15 - 41 U/L   ALT 88 (H) 14 - 54 U/L   Alkaline Phosphatase 165 (H) 38 - 126 U/L   Total Bilirubin 0.8 0.3 - 1.2 mg/dL   GFR  calc non Af Amer >60 >60 mL/min   GFR calc Af Amer >60 >60 mL/min   Anion gap 10 5 - 15  Lactate dehydrogenase     Status: None   Collection Time: 09/28/16  7:38 AM  Result Value Ref Range   LDH 138 98 - 192 U/L  Uric acid     Status: None   Collection Time: 09/28/16  7:38 AM  Result Value Ref Range   Uric Acid, Serum 5.1 2.3 - 6.6 mg/dL  CBC     Status: Abnormal   Collection Time: 09/28/16  7:38 AM  Result Value Ref Range   WBC 8.0 4.0 - 10.5 K/uL   RBC 3.88 3.87 - 5.11 MIL/uL   Hemoglobin 10.1 (L) 12.0 - 15.0  g/dL   HCT 30.6 (L) 36.0 - 46.0 %   MCV 78.9 78.0 - 100.0 fL   MCH 26.0 26.0 - 34.0 pg   MCHC 33.0 30.0 - 36.0 g/dL   RDW 14.0 11.5 - 15.5 %   Platelets 209 150 - 400 K/uL  Protein / creatinine ratio, urine     Status: None   Collection Time: 09/28/16  9:00 AM  Result Value Ref Range   Creatinine, Urine 40.00 mg/dL   Total Protein, Urine 6 mg/dL   Protein Creatinine Ratio 0.15 0.00 - 0.15 mg/mg[Cre]    FHT: Category 1 UC:  q 2-3 min, mild SVE:   Dilation: 5 Effacement (%): 70 Station: -3 Exam by:: Windy Kalata at am Pitocin at 4 mu/min  On 2nd dose of PCN for GBS  Assessment:  Induction for gestational hypertension GBS positive Mild elevation of LFTs  Plan: Continue observation at present. Recheck cervix as appropriate--AROM when vtx has descended. Pain med/labor support prn.' Dr. Mancel Bale updated  [place], [place] 09/28/2016, 10:12 AM

## 2016-09-28 NOTE — Progress Notes (Signed)
  Subjective: Breathing with UCs, more uncomfortable now.  Con[place], at bedside.  Having reflux (chronic issue).  Objective: BP (!) 134/100   Pulse (!) 118   Temp 97.9 F (36.6 C) (Oral)   Resp 20   Ht 5' 2.5" (1.588 m)   Wt 78.5 kg (173 lb)   LMP 01/02/2016   BMI 31.14 kg/m  No intake/output data recorded. No intake/output data recorded.   Vitals:   09/28/16 1500 09/28/16 1506 09/28/16 1524 09/28/16 1600  BP:  (!) 162/102 (!) 134/100   Pulse:  (!) 105 (!) 118   Resp: 18   20  Temp:    97.9 F (36.6 C)  TempSrc:    Oral  Weight:      Height:      BP at 1506 was when patient was standing at the bedside after returning from the BR. Under parameters on recheck.  FHT: Category 1 UC:   regular, every 3 minutes SVE:   Dilation: 7 Effacement (%): 70, 80 Station: -2 Exam by:: Windy Kalata CNM  BBOW--AROM, clear fluid Pitocin at 4 mu/min  Assessment:  Induction for gestational hypertension GBS positive LGA fetus  Plan: Continue current plan. Recheck in 2 hours or prn. Protonix IV now. Dr. Mancel Bale updated.   Donnel Saxon CNM 09/28/2016, 4:05 PM

## 2016-09-28 NOTE — Lactation Note (Signed)
This note was copied from a baby's chart. Lactation Consultation Note  Patient Name: Susan Zamora Today's Date: 09/28/2016 Reason for consult: Initial assessment   Initial assessment with Exp BF mom of 1 hour old infant in [place]. Mom had infant latched and actively feeding. Mom reports she BF her 36 yo for 8 months. She has a Staph abcess removed and BF on one side only. She reports she had not difficulty with that breast when feeding her 36 yo for 11 months.   Enc mom to feed infant STS 8-12 x in 24 hours at first feeding cues. Enc mom to massage/compress breast with feeding. Reviewed colostrum, milk coming to volume and pillow and head support with feeding. Mom without questions/concerns at this time.   BF Resources Handout and [employer] Brochure given, mom informed of IP/OP Services, BF Support Groups and [place] phone #. Enc mom to call out for feeding assistance as needed.    Maternal Data Formula Feeding for Exclusion: No Does the patient have breastfeeding experience prior to this delivery?: Yes  Feeding Feeding Type: Breast Fed Length of feed: 30 min  LATCH Score/Interventions Latch: Repeated attempts needed to sustain latch, nipple held in mouth throughout feeding, stimulation needed to elicit sucking reflex. Intervention(s): Adjust position;Assist with latch  Audible Swallowing: Spontaneous and intermittent  Type of Nipple: Everted at rest and after stimulation  Comfort (Breast/Nipple): Soft / non-tender     Hold (Positioning): Assistance needed to correctly position infant at breast and maintain latch. Intervention(s): Breastfeeding basics reviewed;Support Pillows;Position options;Skin to skin  LATCH Score: 8  Lactation Tools Discussed/Used WIC Program: No   Consult Status Consult Status: Follow-up Date: 09/29/16 Follow-up type: In-patient    Debby Freiberg Hice 09/28/2016, 6:12 PM

## 2016-09-28 NOTE — Progress Notes (Signed)
  Subjective: Comfortable, slept some.  Unaware of UCs while sleeping.  Now aware of occasional cramping.  Objective: BP (!) 140/105   Pulse (!) 106   Temp 97.4 F (36.3 C) (Oral)   Resp 18   Ht 5' 2.5" (1.588 m)   Wt 78.5 kg (173 lb)   LMP 01/02/2016   BMI 31.14 kg/m  No intake/output data recorded. No intake/output data recorded.   Vitals:   09/28/16 0618 09/28/16 0620 09/28/16 0728 09/28/16 0801  BP:  (!) 127/98  (!) 140/105  Pulse:  100  (!) 106  Resp:   18 18  Temp: 98 F (36.7 C)  97.4 F (36.3 C)   TempSrc: Oral  Oral   Weight:      Height:       Max BP 140/108   FHT: Category 1 UC:   irregular, every 2-5 minutes SVE:   Dilation: 5 Effacement (%): 70 Station: -3 Exam by:: VCira Servant  1st PCN dose did not complete until 6am,--next dose due at 10.  Assessment:  IUP at 38 4/7 weeks--induction, s/p foley bulb Gestational hypertension, r/o pre-eclampsia GBS positive Previous C/S, with subsequent VBAC LGA--EFW 9+1 yesterday, previous vag delivery 8+13 Hx MRSA--no active lesions  Plan: Start pitocin per low-dose protocol Continue GBS prophylaxis Repeat PIH labs and PCR this am--results pending Treat BP per parameters Pain med prn--patient currently declines epidural  ID RN contacted me--patient does not require precautions, since she has no active lesions.  In light of positive MRSA swab on admission, will do mupirocin and CHG shower regimen while in hospital. No need for isolation of the baby.   Donnel Saxon CNM 09/28/2016, 8:29 AM

## 2016-09-28 NOTE — Progress Notes (Signed)
Pt transported to [facility] room 303 via wheelchair. Report given to Blain Pais RN.

## 2016-09-29 LAB — COMPREHENSIVE METABOLIC PANEL
ALBUMIN: 2.3 g/dL — AB (ref 3.5–5.0)
ALT: 73 U/L — ABNORMAL HIGH (ref 14–54)
ANION GAP: 8 (ref 5–15)
AST: 50 U/L — ABNORMAL HIGH (ref 15–41)
Alkaline Phosphatase: 152 U/L — ABNORMAL HIGH (ref 38–126)
BUN: 5 mg/dL — ABNORMAL LOW (ref 6–20)
CALCIUM: 7.5 mg/dL — AB (ref 8.9–10.3)
CHLORIDE: 103 mmol/L (ref 101–111)
CO2: 23 mmol/L (ref 22–32)
Creatinine, Ser: 0.69 mg/dL (ref 0.44–1.00)
GFR calc non Af Amer: >60 mL/min (ref >60)
GLUCOSE: 88 mg/dL (ref 65–99)
POTASSIUM: 4.1 mmol/L (ref 3.5–5.1)
SODIUM: 134 mmol/L — AB (ref 135–145)
Total Bilirubin: 1.2 mg/dL (ref 0.3–1.2)
Total Protein: 5 g/dL — ABNORMAL LOW (ref 6.5–8.1)

## 2016-09-29 LAB — CBC
HCT: 25 % — ABNORMAL LOW (ref 36.0–46.0)
HEMOGLOBIN: 8.2 g/dL — AB (ref 12.0–15.0)
MCH: 25.5 pg — AB (ref 26.0–34.0)
MCHC: 32.8 g/dL (ref 30.0–36.0)
MCV: 77.9 fL — ABNORMAL LOW (ref 78.0–100.0)
PLATELETS: 215 10*3/uL (ref 150–400)
RBC: 3.21 MIL/uL — ABNORMAL LOW (ref 3.87–5.11)
RDW: 13.8 % (ref 11.5–15.5)
WBC: 11.8 10*3/uL — ABNORMAL HIGH (ref 4.0–10.5)

## 2016-09-29 LAB — RPR: RPR Ser Ql: NONREACTIVE

## 2016-09-29 LAB — MAGNESIUM: MAGNESIUM: 4.2 mg/dL — AB (ref 1.7–2.4)

## 2016-09-29 LAB — URIC ACID: URIC ACID, SERUM: 4.6 mg/dL (ref 2.3–6.6)

## 2016-09-29 LAB — LACTATE DEHYDROGENASE: LDH: 170 U/L (ref 98–192)

## 2016-09-29 MED ORDER — FERROUS SULFATE 325 (65 FE) MG PO TABS
325.0000 mg | ORAL_TABLET | Freq: Two times a day (BID) | ORAL | Status: DC
  Administered 2016-09-29 – 2016-09-30 (×3): 325 mg via ORAL
  Filled 2016-09-29 (×3): qty 1

## 2016-09-29 NOTE — Progress Notes (Signed)
Subjective: Postpartum Day 1: Vaginal delivery, 2nd degree perineal laceration--induced for gestational HTN, now on magnesium for pre-eclampsia without severe features. Patient up ad lib, reports no syncope or dizziness.   Denies HA, visual sx, SOB, chest or epigastric pain. Feeding:  Breast--going well Contraceptive plan:  Undecided, but considering Paragard.  "Has not done well with progesterone products".  Objective: Vital signs in last 24 hours: Temp:  [97.5 F (36.4 C)-99 F (37.2 C)] 98.7 F (37.1 C) (03/01 0715) Pulse Rate:  [69-122] 88 (03/01 0715) Resp:  [16-20] 18 (03/01 0715) BP: (117-162)/(79-107) 139/90 (03/01 0715) SpO2:  [96 %-100 %] 97 % (03/01 0600)   Vitals:   09/29/16 0403 09/29/16 0500 09/29/16 0600 09/29/16 0715  BP: 135/89 (!) 140/94 (!) 138/91 139/90  Pulse: 87 88 83 88  Resp: 18 20 20 18   Temp:    98.7 F (37.1 C)  TempSrc:    Oral  SpO2: 98% 98% 97%   Weight:      Height:        Physical Exam:  General: alert Lochia: appropriate Uterine Fundus: firm Perineum: healing well, small hemorrhoid DVT Evaluation: No evidence of DVT seen on physical exam. DTR 2+, no clonus at present   CBC Latest Ref Rng & Units 09/29/2016 09/28/2016 09/28/2016  WBC 4.0 - 10.5 K/uL 11.8(H) 8.0 8.1  Hemoglobin 12.0 - 15.0 g/dL 8.2(L) 10.1(L) 10.0(L)  Hematocrit 36.0 - 46.0 % 25.0(L) 30.6(L) 30.9(L)  Platelets 150 - 400 K/uL 215 209 211   CMP Latest Ref Rng & Units 09/29/2016 09/28/2016 04/01/2016  Glucose 65 - 99 mg/dL 88 92 97  BUN 6 - 20 mg/dL 5(L) 6 7  Creatinine 0.44 - 1.00 mg/dL 0.69 0.80 0.60  Sodium 135 - 145 mmol/L 134(L) 135 136  Potassium 3.5 - 5.1 mmol/L 4.1 4.1 3.9  Chloride 101 - 111 mmol/L 103 103 105  CO2 22 - 32 mmol/L 23 22 24   Calcium 8.9 - 10.3 mg/dL 7.5(L) 8.7(L) 9.0  Total Protein 6.5 - 8.1 g/dL 5.0(L) 6.3(L) 6.5  Total Bilirubin 0.3 - 1.2 mg/dL 1.2 0.8 1.0  Alkaline Phos 38 - 126 U/L 152(H) 165(H) 52  AST 15 - 41 U/L 50(H) 65(H) 16  ALT 14 - 54  U/L 73(H) 88(H) 18   Magnesium infusion begun at 2215.  Assessment/Plan: Status post vaginal delivery day 1--s/p induction for gestational hypertension Now pre-eclampsia without severe features. LFTs trending downward Anemia without hemodynamic instability Stable  Continue current care. Complete 24 hours of magnesium sulfate. Patient declines transfusion. CBC, CMP in am Orthostatic BPs Fe BID Reviewed contraceptive questions. Advised patient may need BP meds upon d/c.   Plan [facility] RN visit early next week and prn for BP check. Plan for discharge tomorrow   [place], [place] 09/29/2016, 8:37 AM

## 2016-09-29 NOTE — Lactation Note (Signed)
This note was copied from a baby's chart. Lactation Consultation Note  Patient Name: Boy Alyxa Stache [id number] Date: 09/29/2016 Reason for consult: Follow-up assessment    With this mom and term baby, states breast feeding going well, and she does not require lactation help.   Maternal Data    Feeding    LATCH Score/Interventions                      Lactation Tools Discussed/Used     Consult Status Consult Status: Complete Follow-up type: Call as needed    Tonna Corner 09/29/2016, 8:54 AM

## 2016-09-30 LAB — COMPREHENSIVE METABOLIC PANEL
ALBUMIN: 2.1 g/dL — AB (ref 3.5–5.0)
ALK PHOS: 132 U/L — AB (ref 38–126)
ALT: 65 U/L — ABNORMAL HIGH (ref 14–54)
ANION GAP: 8 (ref 5–15)
AST: 43 U/L — ABNORMAL HIGH (ref 15–41)
BUN: 7 mg/dL (ref 6–20)
CHLORIDE: 105 mmol/L (ref 101–111)
CO2: 24 mmol/L (ref 22–32)
Calcium: 7 mg/dL — ABNORMAL LOW (ref 8.9–10.3)
Creatinine, Ser: 0.68 mg/dL (ref 0.44–1.00)
GFR calc non Af Amer: >60 mL/min (ref >60)
GLUCOSE: 74 mg/dL (ref 65–99)
POTASSIUM: 3.7 mmol/L (ref 3.5–5.1)
SODIUM: 137 mmol/L (ref 135–145)
Total Bilirubin: 0.4 mg/dL (ref 0.3–1.2)
Total Protein: 5 g/dL — ABNORMAL LOW (ref 6.5–8.1)

## 2016-09-30 LAB — CBC
HCT: 22.8 % — ABNORMAL LOW (ref 36.0–46.0)
HEMOGLOBIN: 7.6 g/dL — AB (ref 12.0–15.0)
MCH: 26.6 pg (ref 26.0–34.0)
MCHC: 33.3 g/dL (ref 30.0–36.0)
MCV: 79.7 fL (ref 78.0–100.0)
Platelets: 238 10*3/uL (ref 150–400)
RBC: 2.86 MIL/uL — ABNORMAL LOW (ref 3.87–5.11)
RDW: 14.3 % (ref 11.5–15.5)
WBC: 8.7 10*3/uL (ref 4.0–10.5)

## 2016-09-30 MED ORDER — FERROUS SULFATE 325 (65 FE) MG PO TBEC: 325.0000 mg | DELAYED_RELEASE_TABLET | Freq: Two times a day (BID) | ORAL | 3 refills | Status: DC

## 2016-09-30 MED ORDER — IBUPROFEN 600 MG PO TABS: 600.0000 mg | ORAL_TABLET | Freq: Four times a day (QID) | ORAL | 0 refills | Status: DC

## 2016-09-30 MED ORDER — LABETALOL HCL 100 MG PO TABS: 100.0000 mg | ORAL_TABLET | Freq: Two times a day (BID) | ORAL | 2 refills | Status: DC

## 2016-09-30 NOTE — Discharge Instructions (Signed)

## 2016-09-30 NOTE — Discharge Summary (Signed)
[facility] Discharge Summary   Patient Name:   Susan Zamora DOB:     Apr 05, 1981 MRN:     [id number]  Date of Admission:   09/28/2016 Date of Discharge:  09/30/2016  Admitting diagnosis:    INDUCTION Principal Problem:   VBAC (vaginal birth after Cesarean) Active Problems:   Gestational hypertension   Vaginal delivery  Term Pregnancy Delivered, Gestational Hypertension and Preeclampsia (mild)    Discharge diagnosis:    INDUCTION Principal Problem:   VBAC (vaginal birth after Cesarean) Active Problems:   Gestational hypertension   Vaginal delivery  Term Pregnancy Delivered and Preeclampsia (mild)                                                                     Post partum procedures:   Type of Delivery:  Vaginal  Delivering Provider: Donnel Saxon   Date of Delivery:  09/28/16  Newborn Data:    Live born female  Birth Weight: 8 lb 13.3 oz (4005 g) APGAR: 7, 7  Baby's Name:  Lissa Merlin Baby Feeding:   Breast Disposition:   home with mother  Complications:   None  Hospital course:      Induction of Labor With Vaginal Delivery   36 y.o. yo [phone] at [redacted]w[redacted]d was admitted to the hospital 09/28/2016 for induction of labor.  Indication for induction: Gestational hypertension.  Patient had an uncomplicated labor course as follows: Membrane Rupture Time/Date: 3:55 PM ,09/28/2016   Intrapartum Procedures: Episiotomy: None [1]                                         Lacerations:  2nd degree [3];Perineal [11]  Patient had delivery of a Viable infant.  Information for the patient's newborn:  Ashlen, Shero [id number]  Delivery Method: [facility]   09/28/2016  Details of delivery can be found in separate delivery note.  Patient had a routine postpartum course. Patient is discharged home 09/30/16.  Physical Exam:   Vitals:   09/29/16 1821 09/29/16 2015 09/30/16 0039 09/30/16 0430  BP:  (!) 136/93 (!) 137/93 (!) 135/95  Pulse:  95 90 91  Resp: 20 18  18 18   Temp:  97.7 F (36.5 C) 97.5 F (36.4 C) 97.5 F (36.4 C)  TempSrc:  Oral Oral Oral  SpO2:    100%  Weight:      Height:       General: alert, cooperative and no distress Lochia: appropriate Uterine Fundus: firm Incision: N/A DVT Evaluation: No evidence of DVT seen on physical exam.  Labs:  CBC Latest Ref Rng & Units 09/30/2016 09/29/2016 09/28/2016  WBC 4.0 - 10.5 K/uL 8.7 11.8(H) 8.0  Hemoglobin 12.0 - 15.0 g/dL 7.6(L) 8.2(L) 10.1(L)  Hematocrit 36.0 - 46.0 % 22.8(L) 25.0(L) 30.6(L)  Platelets 150 - 400 K/uL 238 215 209     CMP Latest Ref Rng & Units 09/30/2016 09/29/2016 09/28/2016  Glucose 65 - 99 mg/dL 74 88 92  BUN 6 - 20 mg/dL 7 5(L) 6  Creatinine 0.44 - 1.00 mg/dL 0.68 0.69 0.80  Sodium 135 - 145 mmol/L 137 134(L) 135  Potassium 3.5 -  5.1 mmol/L 3.7 4.1 4.1  Chloride 101 - 111 mmol/L 105 103 103  CO2 22 - 32 mmol/L 24 23 22   Calcium 8.9 - 10.3 mg/dL 7.0(L) 7.5(L) 8.7(L)  Total Protein 6.5 - 8.1 g/dL 5.0(L) 5.0(L) 6.3(L)  Total Bilirubin 0.3 - 1.2 mg/dL 0.4 1.2 0.8  Alkaline Phos 38 - 126 U/L 132(H) 152(H) 165(H)  AST 15 - 41 U/L 43(H) 50(H) 65(H)  ALT 14 - 54 U/L 65(H) 73(H) 88(H)    Discharge instruction: per After Visit Summary and "Baby and Me Booklet".  After Visit Meds:  Allergies as of 09/30/2016      Reactions   Amoxicillin Nausea And Vomiting   Has patient had a PCN reaction causing immediate rash, facial/tongue/throat swelling, SOB or lightheadedness with hypotension: No Has patient had a PCN reaction causing severe rash involving mucus membranes or skin necrosis: No Has patient had a PCN reaction that required hospitalization No Has patient had a PCN reaction occurring within the last 10 years: Yes If all of the above answers are "NO", then may proceed with Cephalosporin use.   Codeine Nausea Only      Medication List    STOP taking these medications   B-6 PO   doxylamine (Sleep) 25 MG tablet Commonly known as:  UNISOM   promethazine 12.5 MG  tablet Commonly known as:  PHENERGAN   ranitidine 150 MG tablet Commonly known as:  ZANTAC     TAKE these medications   acetaminophen 325 MG tablet Commonly known as:  TYLENOL Take 650 mg by mouth every 6 (six) hours as needed for mild pain, moderate pain or headache.   ferrous sulfate 325 (65 FE) MG EC tablet Take 1 tablet (325 mg total) by mouth 2 (two) times daily.   ibuprofen 600 MG tablet Commonly known as:  ADVIL,MOTRIN Take 1 tablet (600 mg total) by mouth every 6 (six) hours.   labetalol 100 MG tablet Commonly known as:  NORMODYNE Take 1 tablet (100 mg total) by mouth 2 (two) times daily.   prenatal multivitamin Tabs tablet Take 1 tablet by mouth daily.   sertraline 50 MG tablet Commonly known as:  ZOLOFT Take 1 tablet by mouth daily.       Diet: routine diet  Activity: Advance as tolerated. Pelvic rest for 6 weeks.   Outpatient follow up:1 week and Baby Love visit ordered Follow up Appt:No future appointments. Follow up visit: No Follow-up on file.  Postpartum contraception: Undecided  09/30/2016 Larey Days, CNM

## 2016-09-30 NOTE — Lactation Note (Signed)
This note was copied from a baby's chart. Lactation Consultation Note  Patient Name: Susan Zamora [id number] Date: 09/30/2016 Reason for consult: Follow-up assessment   Follow up with mom of 96 hour old infant. Mom reports infant is feeding well. She reports she feels fuller today and denies nipple pain.tenderness. Mom has a pump at home. She has no questions/concerns. Engorgement prevention/treatment reviewed. Enc mom to call with questions/concerns.    Maternal Data Formula Feeding for Exclusion: No Does the patient have breastfeeding experience prior to this delivery?: Yes  Feeding Feeding Type: Breast Fed Length of feed: 25 min  LATCH Score/Interventions                      Lactation Tools Discussed/Used WIC Program: No   Consult Status Consult Status: Complete Follow-up type: Call as needed    Donn Pierini 09/30/2016, 9:56 AM

## 2018-10-08 ENCOUNTER — Encounter: Payer: Self-pay | Admitting: Psychiatry

## 2018-10-08 ENCOUNTER — Ambulatory Visit: Payer: 59 | Admitting: Psychiatry

## 2018-10-08 VITALS — BP 126/95 | HR 75

## 2018-10-08 DIAGNOSIS — F3281 Premenstrual dysphoric disorder: Secondary | ICD-10-CM

## 2018-10-08 DIAGNOSIS — F419 Anxiety disorder, unspecified: Secondary | ICD-10-CM

## 2018-10-08 MED ORDER — SERTRALINE HCL 50 MG PO TABS: 75.0000 mg | ORAL_TABLET | Freq: Every day | ORAL | 11 refills | Status: DC

## 2018-10-08 MED ORDER — L-METHYLFOLATE 15 MG PO TABS: 15.0000 mg | ORAL_TABLET | Freq: Every day | ORAL | 3 refills | Status: DC

## 2018-10-08 NOTE — Progress Notes (Signed)
Susan Zamora [id number] 01-20-1981 38 y.o.  Subjective:   Patient ID:  Susan Zamora is a 38 y.o. (DOB 02/08/1981) female.  Chief Complaint:  Chief Complaint  Patient presents with  . Anxiety  . Follow-up    PMDD    HPI Susan Zamora presents to the office today for follow-up of h/o anxiety. She reports that she is 3 years postpartum and 3 years with IUD and "feeling everything is evening out" and that menses is starting to normalize and "not feeling so many hormonal shifts." She reports that her anxiety has been mild and in response to identifiable significant stressors. Feels that anxiety is manageable. Denies any physical s/s with anxiety. Describes mood as "pretty good." Reports mild occasional depression several days prior to menses. Denies any persistent sadness. She reports that her sleep has improved and that her 3rd child is now sleeping independently throughout the night. Appetite has been stable. She reports adequate energy and motivation. Concentration has been adequate. She reports that she has been trying to get herself more organized. Denies SI.   Past Psychiatric Medication Trials: Zoloft Lexapro Deplin   Review of Systems:  Review of Systems  Musculoskeletal: Negative for gait problem.  Neurological: Negative for tremors.  Psychiatric/Behavioral:       Please refer to HPI    Medications: I have reviewed the patient's current medications.  Current Outpatient Medications  Medication Sig Dispense Refill  . Copper (PARAGARD) IUD IUD 1 each by Intrauterine route once.    Marland Kitchen acetaminophen (TYLENOL) 325 MG tablet Take 650 mg by mouth every 6 (six) hours as needed for mild pain, moderate pain or headache.    Marland Kitchen L-Methylfolate 15 MG TABS Take 1 tablet (15 mg total) by mouth daily. 90 tablet 3  . sertraline (ZOLOFT) 50 MG tablet Take 1.5 tablets (75 mg total) by mouth daily for 30 days. 45 tablet 11   No current facility-administered medications for  this visit.     Medication Side Effects: None  Allergies:  Allergies  Allergen Reactions  . Amoxicillin Nausea And Vomiting    Has patient had a PCN reaction causing immediate rash, facial/tongue/throat swelling, SOB or lightheadedness with hypotension: No Has patient had a PCN reaction causing severe rash involving mucus membranes or skin necrosis: No Has patient had a PCN reaction that required hospitalization No Has patient had a PCN reaction occurring within the last 10 years: Yes If all of the above answers are "NO", then may proceed with Cephalosporin use.   . Codeine Nausea Only    Past Medical History:  Diagnosis Date  . Abnormal Pap smear 06/2011   ASCUS  . Depression   . H/O varicella   . Hyperemesis arising during pregnancy 2013    Family History  Problem Relation Age of Onset  . ADD / ADHD Mother   . Depression Mother   . Alcohol abuse Father   . Prostate cancer Father   . Kidney disease Sister   . Alcohol abuse Sister   . Depression Sister   . ADD / ADHD Sister   . Alcohol abuse Brother   . ADD / ADHD Brother   . Depression Maternal Aunt   . Depression Paternal Aunt   . Depression Paternal Grandmother     Social History   Socioeconomic History  . Marital status: Married    Spouse name: Not on file  . Number of children: Not on file  . Years of education: Not  on file  . Highest education level: Not on file  Occupational History  . Not on file  Social Needs  . Financial resource strain: Not on file  . Food insecurity:    Worry: Not on file    Inability: Not on file  . Transportation needs:    Medical: Not on file    Non-medical: Not on file  Tobacco Use  . Smoking status: Never Smoker  . Smokeless tobacco: Never Used  Substance and Sexual Activity  . Alcohol use: Yes    Comment: 3 drinks per week  . Drug use: No  . Sexual activity: Yes    Birth control/protection: None    Comment: approx 15 wks - stopped growing 8 wks ago  Lifestyle   . Physical activity:    Days per week: Not on file    Minutes per session: Not on file  . Stress: Not on file  Relationships  . Social connections:    Talks on phone: Not on file    Gets together: Not on file    Attends religious service: Not on file    Active member of club or organization: Not on file    Attends meetings of clubs or organizations: Not on file    Relationship status: Not on file  . Intimate partner violence:    Fear of current or ex partner: Not on file    Emotionally abused: Not on file    Physically abused: Not on file    Forced sexual activity: Not on file  Other Topics Concern  . Not on file  Social History Narrative  . Not on file    Past Medical History, Surgical history, Social history, and Family history were reviewed and updated as appropriate.   Please see review of systems for further details on the patient's review from today.   Objective:   Physical Exam:  BP (!) 126/95   Pulse 75   Physical Exam Constitutional:      General: She is not in acute distress.    Appearance: She is well-developed.  Musculoskeletal:        General: No deformity.  Neurological:     Mental Status: She is alert and oriented to person, place, and time.     Coordination: Coordination normal.  Psychiatric:        Attention and Perception: Attention and perception normal. She does not perceive auditory or visual hallucinations.        Mood and Affect: Mood normal. Mood is not anxious or depressed. Affect is not labile, blunt, angry or inappropriate.        Speech: Speech normal.        Behavior: Behavior normal.        Thought Content: Thought content normal. Thought content does not include homicidal or suicidal ideation. Thought content does not include homicidal or suicidal plan.        Cognition and Memory: Cognition and memory normal.        Judgment: Judgment normal.     Comments: Insight intact. No delusions.      Lab Review:     Component Value  Date/Time   NA 137 09/30/2016 0539   K 3.7 09/30/2016 0539   CL 105 09/30/2016 0539   CO2 24 09/30/2016 0539   GLUCOSE 74 09/30/2016 0539   GLUCOSE 90 02/10/2012 0800   BUN 7 09/30/2016 0539   CREATININE 0.68 09/30/2016 0539   CALCIUM 7.0 (L) 09/30/2016 0539   PROT 5.0 (  L) 09/30/2016 0539   ALBUMIN 2.1 (L) 09/30/2016 0539   AST 43 (H) 09/30/2016 0539   ALT 65 (H) 09/30/2016 0539   ALKPHOS 132 (H) 09/30/2016 0539   BILITOT 0.4 09/30/2016 0539   GFRNONAA >60 09/30/2016 0539   GFRAA >60 09/30/2016 0539       Component Value Date/Time   WBC 8.7 09/30/2016 0539   RBC 2.86 (L) 09/30/2016 0539   HGB 7.6 (L) 09/30/2016 0539   HCT 22.8 (L) 09/30/2016 0539   PLT 238 09/30/2016 0539   MCV 79.7 09/30/2016 0539   MCH 26.6 09/30/2016 0539   MCHC 33.3 09/30/2016 0539   RDW 14.3 09/30/2016 0539    No results found for: POCLITH, LITHIUM   No results found for: PHENYTOIN, PHENOBARB, VALPROATE, CBMZ   .res Assessment: Plan:   Discussed that generic l-methylfolate is now available at a reduced cost with good Rx discount.  Patient reports that she would like to resume l-methylfolate and that her goal would be to possibly reduce dose of sertraline.  Discussed that she could try reducing dose of sertraline to 50 mg daily.  Discussed that another option would be to take 75 mg daily 1 to 2 weeks before the onset of menses and 50 mg daily for the remainder of the month, since patient reports that the only time she experiences some mood changes is immediately prior to menses.  Will write prescription for sertraline 75 mg daily for anxiety and PMDD in the event patient notices any worsening signs and symptoms with lower doses and so that she has the ability to take 75 mg daily prior to menses.  Patient advised to contact office with any questions, adverse effects, or acute worsening in signs and symptoms.  Patient to follow-up in 1 year or sooner if clinically indicated.   Premenstrual dysphoric  disorder - Plan: sertraline (ZOLOFT) 50 MG tablet, L-Methylfolate 15 MG TABS  Anxiety disorder, unspecified type - Plan: sertraline (ZOLOFT) 50 MG tablet  Please see After Visit Summary for patient specific instructions.  No future appointments.  No orders of the defined types were placed in this encounter.     -------------------------------

## 2019-05-13 ENCOUNTER — Other Ambulatory Visit: Payer: Self-pay

## 2019-05-13 ENCOUNTER — Ambulatory Visit (INDEPENDENT_AMBULATORY_CARE_PROVIDER_SITE_OTHER): Payer: PRIVATE HEALTH INSURANCE | Admitting: Psychiatry

## 2019-05-13 ENCOUNTER — Encounter: Payer: Self-pay | Admitting: Psychiatry

## 2019-05-13 DIAGNOSIS — F419 Anxiety disorder, unspecified: Secondary | ICD-10-CM

## 2019-05-13 DIAGNOSIS — F332 Major depressive disorder, recurrent severe without psychotic features: Secondary | ICD-10-CM | POA: Diagnosis not present

## 2019-05-13 MED ORDER — BUPROPION HCL ER (XL) 150 MG PO TB24
150.0000 mg | ORAL_TABLET | Freq: Every day | ORAL | 1 refills | Status: DC
Start: 2019-05-13 — End: 2019-06-10

## 2019-05-13 NOTE — Progress Notes (Signed)
Susan Zamora [id number] [birth year] 38 y.o.  Subjective:   Patient ID:  Susan Zamora is a 38 y.o. (DOB 06-Aug-1980) female.  Chief Complaint:  Chief Complaint  Patient presents with  . Depression    HPI Susan Zamora presents to the office today for follow-up of depression and anxiety. She reports increased difficulty with depression since June. Persistent sad mood. She reports that she is feeling easily overwhelmed and is irritated. She reports that she has had very low energy and motivation. Reports that she is doing what absolutely needs to get done and then is neglecting her self care and hygiene. Reports that she has likely gained 25 lbs. She reports that she has not been sleeping well. Reports difficulty falling and staying asleep. Reports that she has been having more vivid stress dreams. She reports that she has had decreased sexual interest and enjoyment in general. She denies current anxiety. She reports impaired concentration and at times does not recall things. Reports that she has been socially withdrawn and has not responded to friends and this is not like her. Denies SI.   She reports that she was having wine at the end of the day and noticed intake was increasing and then stopped wine one month ago. Reports that she is currently less active than she has ever been.  Has been at home with 3 children constantly since the start of the pandemic and has had limited time alone aside from running to a store. 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 Children are kindergarten, 1st grade, and 75 year old. Husband has been having to work throughout the pandemic and is gone throughout the day. Has been unable to see multiple family members due to them having pre-existing conditions.  Has completely weaned 40 year old and reports that this has been emotional for her and has likely caused hormonal changes. Reports that son will cry and insist on breast feeding. Reports that she had  difficulty turning 38.   Never had a seizure.   Past Psychiatric Medication Trials: Zoloft- Highest dose has been 100 mg and had some anxiety.  Lexapro- took 20 mg po qd Deplin  Review of Systems:  Review of Systems  Musculoskeletal: Negative for gait problem.  Neurological: Negative for tremors.  Psychiatric/Behavioral:       Please refer to HPI    Medications: I have reviewed the patient's current medications.  Current Outpatient Medications  Medication Sig Dispense Refill  . acetaminophen (TYLENOL) 325 MG tablet Take 650 mg by mouth every 6 (six) hours as needed for mild pain, moderate pain or headache.    . cetirizine (ZYRTEC) 10 MG tablet Take 10 mg by mouth daily.    . Cholecalciferol (VITAMIN D) 50 MCG (2000 UT) CAPS Take by mouth.    . Copper (PARAGARD) IUD IUD 1 each by Intrauterine route once.    Marland Kitchen buPROPion (WELLBUTRIN XL) 150 MG 24 hr tablet Take 1 tablet (150 mg total) by mouth daily. 30 tablet 1  . L-Methylfolate 15 MG TABS Take 1 tablet (15 mg total) by mouth daily. 90 tablet 3  . sertraline (ZOLOFT) 50 MG tablet Take 1.5 tablets (75 mg total) by mouth daily for 30 days. 45 tablet 11   No current facility-administered medications for this visit.     Medication Side Effects: None  Allergies:  Allergies  Allergen Reactions  . Amoxicillin Nausea And Vomiting    Has patient had a PCN reaction causing immediate rash, facial/tongue/throat  swelling, SOB or lightheadedness with hypotension: No Has patient had a PCN reaction causing severe rash involving mucus membranes or skin necrosis: No Has patient had a PCN reaction that required hospitalization No Has patient had a PCN reaction occurring within the last 10 years: Yes If all of the above answers are "NO", then may proceed with Cephalosporin use.   . Codeine Nausea Only    Past Medical History:  Diagnosis Date  . Abnormal Pap smear 06/2011   ASCUS  . Depression   . Gestational hypertension 09/28/2016  .  H/O varicella   . Hyperemesis arising during pregnancy 2013  . Vaginal delivery 09/28/2016  . Vitamin D deficiency     Family History  Problem Relation Age of Onset  . ADD / ADHD Mother   . Depression Mother   . Alcohol abuse Father   . Prostate cancer Father   . Kidney disease Sister   . Alcohol abuse Sister   . Depression Sister   . ADD / ADHD Sister   . Alcohol abuse Brother   . ADD / ADHD Brother   . Depression Maternal Aunt   . Depression Paternal Aunt   . Depression Paternal Grandmother     Social History   Socioeconomic History  . Marital status: Married    Spouse name: Not on file  . Number of children: Not on file  . Years of education: Not on file  . Highest education level: Not on file  Occupational History  . Not on file  Social Needs  . Financial resource strain: Not on file  . Food insecurity    Worry: Not on file    Inability: Not on file  . Transportation needs    Medical: Not on file    Non-medical: Not on file  Tobacco Use  . Smoking status: Never Smoker  . Smokeless tobacco: Never Used  Substance and Sexual Activity  . Alcohol use: Yes    Comment: 3 drinks per week  . Drug use: No  . Sexual activity: Yes    Birth control/protection: None    Comment: approx 15 wks - stopped growing 8 wks ago  Lifestyle  . Physical activity    Days per week: Not on file    Minutes per session: Not on file  . Stress: Not on file  Relationships  . Social Herbalist on phone: Not on file    Gets together: Not on file    Attends religious service: Not on file    Active member of club or organization: Not on file    Attends meetings of clubs or organizations: Not on file    Relationship status: Not on file  . Intimate partner violence    Fear of current or ex partner: Not on file    Emotionally abused: Not on file    Physically abused: Not on file    Forced sexual activity: Not on file  Other Topics Concern  . Not on file  Social History  Narrative  . Not on file    Past Medical History, Surgical history, Social history, and Family history were reviewed and updated as appropriate.   Please see review of systems for further details on the patient's review from today.   Objective:   Physical Exam:  There were no vitals taken for this visit.  Physical Exam Constitutional:      General: She is not in acute distress.    Appearance: She is well-developed.  Musculoskeletal:        General: No deformity.  Neurological:     Mental Status: She is alert and oriented to person, place, and time.     Coordination: Coordination normal.  Psychiatric:        Attention and Perception: Attention and perception normal. She does not perceive auditory or visual hallucinations.        Mood and Affect: Mood is depressed. Mood is not anxious. Affect is not labile, blunt, angry or inappropriate.        Speech: Speech normal.        Behavior: Behavior normal.        Thought Content: Thought content normal. Thought content is not paranoid or delusional. Thought content does not include homicidal or suicidal ideation. Thought content does not include homicidal or suicidal plan.        Cognition and Memory: Cognition and memory normal.        Judgment: Judgment normal.     Comments: Sad affect Insight intact. No delusions.      Lab Review:     Component Value Date/Time   NA 137 09/30/2016 0539   K 3.7 09/30/2016 0539   CL 105 09/30/2016 0539   CO2 24 09/30/2016 0539   GLUCOSE 74 09/30/2016 0539   GLUCOSE 90 02/10/2012 0800   BUN 7 09/30/2016 0539   CREATININE 0.68 09/30/2016 0539   CALCIUM 7.0 (L) 09/30/2016 0539   PROT 5.0 (L) 09/30/2016 0539   ALBUMIN 2.1 (L) 09/30/2016 0539   AST 43 (H) 09/30/2016 0539   ALT 65 (H) 09/30/2016 0539   ALKPHOS 132 (H) 09/30/2016 0539   BILITOT 0.4 09/30/2016 0539   GFRNONAA >60 09/30/2016 0539   GFRAA >60 09/30/2016 0539       Component Value Date/Time   WBC 8.7 09/30/2016 0539   RBC  2.86 (L) 09/30/2016 0539   HGB 7.6 (L) 09/30/2016 0539   HCT 22.8 (L) 09/30/2016 0539   PLT 238 09/30/2016 0539   MCV 79.7 09/30/2016 0539   MCH 26.6 09/30/2016 0539   MCHC 33.3 09/30/2016 0539   RDW 14.3 09/30/2016 0539    No results found for: POCLITH, LITHIUM   No results found for: PHENYTOIN, PHENOBARB, VALPROATE, CBMZ   .res Assessment: Plan:   Patient seen for 30 minutes and greater than 50% of visit spent counseling patient regarding treatment options for depressive signs and symptoms.  Discussed options would be to switch sertraline to another medication to control both depression and anxiety signs and symptoms or augmentation of sertraline for depression.  Discussed potential benefits, risks, and side effects of augmentation with Wellbutrin XL.  Patient reports that Wellbutrin XL has been effective for a first-degree family member.  Patient agrees to trial of Wellbutrin XL for augmentation of depression in conjunction with sertraline since sertraline is controlling anxiety signs and symptoms and Wellbutrin XL could potentially improve current target signs and symptoms of low mood, decreased energy, amotivation, and increased appetite.  Will start Wellbutrin XL 150 mg p.o. every morning for depression. Continue sertraline 75 mg daily for anxiety and depression. Patient to follow-up with this provider in 4 weeks or sooner if clinically indicated. Patient advised to contact office with any questions, adverse effects, or acute worsening in signs and symptoms.  Susan Zamora was seen today for depression.  Diagnoses and all orders for this visit:  Severe episode of recurrent major depressive disorder, without psychotic features ([place]) -     buPROPion (WELLBUTRIN XL) 150  MG 24 hr tablet; Take 1 tablet (150 mg total) by mouth daily.  Anxiety disorder, unspecified type     Please see After Visit Summary for patient specific instructions.  Future Appointments  Date Time Provider  [place]  06/10/2019  2:30 PM Thayer Headings, PMHNP [facility] None  10/08/2019  9:30 AM Thayer Headings, PMHNP [facility] None    No orders of the defined types were placed in this encounter.   -------------------------------

## 2019-06-10 ENCOUNTER — Ambulatory Visit (INDEPENDENT_AMBULATORY_CARE_PROVIDER_SITE_OTHER): Payer: PRIVATE HEALTH INSURANCE | Admitting: Psychiatry

## 2019-06-10 ENCOUNTER — Other Ambulatory Visit: Payer: Self-pay

## 2019-06-10 ENCOUNTER — Encounter: Payer: Self-pay | Admitting: Psychiatry

## 2019-06-10 DIAGNOSIS — F330 Major depressive disorder, recurrent, mild: Secondary | ICD-10-CM | POA: Diagnosis not present

## 2019-06-10 DIAGNOSIS — F3281 Premenstrual dysphoric disorder: Secondary | ICD-10-CM

## 2019-06-10 DIAGNOSIS — F419 Anxiety disorder, unspecified: Secondary | ICD-10-CM

## 2019-06-10 MED ORDER — BUPROPION HCL ER (XL) 150 MG PO TB24: 150.0000 mg | ORAL_TABLET | Freq: Every day | ORAL | 0 refills | Status: DC

## 2019-06-10 MED ORDER — SERTRALINE HCL 50 MG PO TABS: 75.0000 mg | ORAL_TABLET | Freq: Every day | ORAL | 1 refills | Status: DC

## 2019-06-10 NOTE — Progress Notes (Signed)
Susan Zamora [id number] 1981-06-01 38 y.o.  Subjective:   Patient ID:  Susan Zamora is a 38 y.o. (DOB 04-Feb-1981) female.  Chief Complaint:  Chief Complaint  Patient presents with  . Follow-up    Depression, Anxiety    HPI Susan Zamora presents to the office today for follow-up of depression  She reports that she is feeling "better." She reports initially she felt like she had to get up and do multiple things. She reports that energy and motivation has since returned to baseline. She noticed a significant improvement in mood and mood could have been slightly elevated. Reports that mood is "drastically better" and there may be some slight residual depression. Denies anxiety. She reports some mild irritability around menses that is consistent with her baseline. She reports that her concentration has significantly improved. She reports increased enjoyment in things and went to visit her sister and had an enjoyable time. She reports some improvement in hygiene and self-care. She reports increased interest and improved sexual interest. Reports that she has been reaching out more socially. Appetite has stayed the same. Denies SI.   Denies any impulsive or risky behavior.   She reports that she has also noticed improved sleep and estimates sleeping about 7 hours of sleep.   Continues to avoid wine. Children are doing well.   Past Psychiatric Medication Trials: Zoloft- Highest dose has been 100 mg and had some anxiety.  Lexapro- took 20 mg po qd Wellbutrin Xl Deplin  Review of Systems:  Review of Systems  Musculoskeletal: Negative for gait problem.  Neurological: Negative for tremors.  Psychiatric/Behavioral:       Please refer to HPI    Medications: I have reviewed the patient's current medications.  Current Outpatient Medications  Medication Sig Dispense Refill  . buPROPion (WELLBUTRIN XL) 150 MG 24 hr tablet Take 1 tablet (150 mg total) by mouth daily. 90  tablet 0  . cetirizine (ZYRTEC) 10 MG tablet Take 10 mg by mouth daily.    . Cholecalciferol (VITAMIN D) 50 MCG (2000 UT) CAPS Take by mouth.    . Copper (PARAGARD) IUD IUD 1 each by Intrauterine route once.    Marland Kitchen acetaminophen (TYLENOL) 325 MG tablet Take 650 mg by mouth every 6 (six) hours as needed for mild pain, moderate pain or headache.    . sertraline (ZOLOFT) 50 MG tablet Take 1.5 tablets (75 mg total) by mouth daily. 135 tablet 1   No current facility-administered medications for this visit.     Medication Side Effects: None  Allergies:  Allergies  Allergen Reactions  . Amoxicillin Nausea And Vomiting    Has patient had a PCN reaction causing immediate rash, facial/tongue/throat swelling, SOB or lightheadedness with hypotension: No Has patient had a PCN reaction causing severe rash involving mucus membranes or skin necrosis: No Has patient had a PCN reaction that required hospitalization No Has patient had a PCN reaction occurring within the last 10 years: Yes If all of the above answers are "NO", then may proceed with Cephalosporin use.   . Codeine Nausea Only    Past Medical History:  Diagnosis Date  . Abnormal Pap smear 06/2011   ASCUS  . Depression   . Gestational hypertension 09/28/2016  . H/O varicella   . Hyperemesis arising during pregnancy 2013  . Vaginal delivery 09/28/2016  . Vitamin D deficiency     Family History  Problem Relation Age of Onset  . ADD / ADHD Mother   .  Depression Mother   . Alcohol abuse Father   . Prostate cancer Father   . Kidney disease Sister   . Alcohol abuse Sister   . Depression Sister   . ADD / ADHD Sister   . Alcohol abuse Brother   . ADD / ADHD Brother   . Depression Maternal Aunt   . Depression Paternal Aunt   . Depression Paternal Grandmother     Social History   Socioeconomic History  . Marital status: Married    Spouse name: Not on file  . Number of children: Not on file  . Years of education: Not on file  .  Highest education level: Not on file  Occupational History  . Not on file  Social Needs  . Financial resource strain: Not on file  . Food insecurity    Worry: Not on file    Inability: Not on file  . Transportation needs    Medical: Not on file    Non-medical: Not on file  Tobacco Use  . Smoking status: Never Smoker  . Smokeless tobacco: Never Used  Substance and Sexual Activity  . Alcohol use: Yes    Comment: 3 drinks per week  . Drug use: No  . Sexual activity: Yes    Birth control/protection: None    Comment: approx 15 wks - stopped growing 8 wks ago  Lifestyle  . Physical activity    Days per week: Not on file    Minutes per session: Not on file  . Stress: Not on file  Relationships  . Social Herbalist on phone: Not on file    Gets together: Not on file    Attends religious service: Not on file    Active member of club or organization: Not on file    Attends meetings of clubs or organizations: Not on file    Relationship status: Not on file  . Intimate partner violence    Fear of current or ex partner: Not on file    Emotionally abused: Not on file    Physically abused: Not on file    Forced sexual activity: Not on file  Other Topics Concern  . Not on file  Social History Narrative  . Not on file    Past Medical History, Surgical history, Social history, and Family history were reviewed and updated as appropriate.   Please see review of systems for further details on the patient's review from today.   Objective:   Physical Exam:  BP (!) 148/93   Pulse 87   Physical Exam Constitutional:      General: She is not in acute distress.    Appearance: She is well-developed.  Musculoskeletal:        General: No deformity.  Neurological:     Mental Status: She is alert and oriented to person, place, and time.     Coordination: Coordination normal.  Psychiatric:        Attention and Perception: Attention and perception normal. She does not perceive  auditory or visual hallucinations.        Mood and Affect: Mood normal. Mood is not anxious or depressed. Affect is not labile, blunt, angry or inappropriate.        Speech: Speech normal.        Behavior: Behavior normal.        Thought Content: Thought content normal. Thought content does not include homicidal or suicidal ideation. Thought content does not include homicidal or suicidal plan.  Cognition and Memory: Cognition and memory normal.        Judgment: Judgment normal.     Comments: Insight intact. No delusions.      Lab Review:     Component Value Date/Time   NA 137 09/30/2016 0539   K 3.7 09/30/2016 0539   CL 105 09/30/2016 0539   CO2 24 09/30/2016 0539   GLUCOSE 74 09/30/2016 0539   GLUCOSE 90 02/10/2012 0800   BUN 7 09/30/2016 0539   CREATININE 0.68 09/30/2016 0539   CALCIUM 7.0 (L) 09/30/2016 0539   PROT 5.0 (L) 09/30/2016 0539   ALBUMIN 2.1 (L) 09/30/2016 0539   AST 43 (H) 09/30/2016 0539   ALT 65 (H) 09/30/2016 0539   ALKPHOS 132 (H) 09/30/2016 0539   BILITOT 0.4 09/30/2016 0539   GFRNONAA >60 09/30/2016 0539   GFRAA >60 09/30/2016 0539       Component Value Date/Time   WBC 8.7 09/30/2016 0539   RBC 2.86 (L) 09/30/2016 0539   HGB 7.6 (L) 09/30/2016 0539   HCT 22.8 (L) 09/30/2016 0539   PLT 238 09/30/2016 0539   MCV 79.7 09/30/2016 0539   MCH 26.6 09/30/2016 0539   MCHC 33.3 09/30/2016 0539   RDW 14.3 09/30/2016 0539    No results found for: POCLITH, LITHIUM   No results found for: PHENYTOIN, PHENOBARB, VALPROATE, CBMZ   .res Assessment: Plan:   Will continue Wellbutrin XL 150 mg daily for depression since patient reports a significant improvement in mood and depressive signs and symptoms since starting Wellbutrin XL. Will continue sertraline 75 mg daily for anxiety and depression since sertraline has been effective for her anxiety signs and symptoms and PMDD. Discussed that her blood pressure was slightly elevated today.  Patient reports  that she has history of having blood pressure readings at time of medical appointments.  Recommended periodically checking blood pressure using family members machine and to contact her PCP if she notices a pattern of ongoing elevated blood pressure. Patient to follow-up in 3 months or sooner if clinically indicated. Patient advised to contact office with any questions, adverse effects, or acute worsening in signs and symptoms.  Susan Zamora was seen today for follow-up.  Diagnoses and all orders for this visit:  Severe episode of recurrent major depressive disorder, without psychotic features ([place]) -     buPROPion (WELLBUTRIN XL) 150 MG 24 hr tablet; Take 1 tablet (150 mg total) by mouth daily.  Premenstrual dysphoric disorder -     sertraline (ZOLOFT) 50 MG tablet; Take 1.5 tablets (75 mg total) by mouth daily.  Anxiety disorder, unspecified type -     sertraline (ZOLOFT) 50 MG tablet; Take 1.5 tablets (75 mg total) by mouth daily.     Please see After Visit Summary for patient specific instructions.  Future Appointments  Date Time Provider [place]  09/10/2019 10:00 AM Thayer Headings, PMHNP [facility] None  10/08/2019  9:30 AM Thayer Headings, PMHNP [facility] None    No orders of the defined types were placed in this encounter.   -------------------------------

## 2019-07-05 ENCOUNTER — Other Ambulatory Visit: Payer: Self-pay | Admitting: Psychiatry

## 2019-07-05 DIAGNOSIS — F330 Major depressive disorder, recurrent, mild: Secondary | ICD-10-CM

## 2019-09-10 ENCOUNTER — Ambulatory Visit: Payer: PRIVATE HEALTH INSURANCE | Admitting: Psychiatry

## 2019-10-08 ENCOUNTER — Encounter: Payer: Self-pay | Admitting: Psychiatry

## 2019-10-08 ENCOUNTER — Ambulatory Visit (INDEPENDENT_AMBULATORY_CARE_PROVIDER_SITE_OTHER): Payer: PRIVATE HEALTH INSURANCE | Admitting: Psychiatry

## 2019-10-08 ENCOUNTER — Other Ambulatory Visit: Payer: Self-pay

## 2019-10-08 DIAGNOSIS — F330 Major depressive disorder, recurrent, mild: Secondary | ICD-10-CM

## 2019-10-08 DIAGNOSIS — F3281 Premenstrual dysphoric disorder: Secondary | ICD-10-CM | POA: Diagnosis not present

## 2019-10-08 DIAGNOSIS — F419 Anxiety disorder, unspecified: Secondary | ICD-10-CM | POA: Diagnosis not present

## 2019-10-08 MED ORDER — BUPROPION HCL ER (XL) 150 MG PO TB24: ORAL_TABLET | ORAL | 2 refills | Status: DC

## 2019-10-08 MED ORDER — SERTRALINE HCL 50 MG PO TABS: 75.0000 mg | ORAL_TABLET | Freq: Every day | ORAL | 2 refills | Status: DC

## 2019-10-08 NOTE — Progress Notes (Signed)
Susan Zamora [id number] 06-18-1981 39 y.o.  Subjective:   Patient ID:  Susan Zamora is a 39 y.o. (DOB 09-Nov-1980) female.  Chief Complaint:  Chief Complaint  Patient presents with  . Follow-up    h/o depression    HPI Susan Zamora presents to the office today for follow-up of depression. She reports that her mood has been good and denies any significant depression. Denies anxiety. She reports improved energy. Motivation has been good. She reports that they are doing more things and activities. Sleep has improved. Estimates sleeping about 7 hours a night which is consistent with her baseline. She reports that her appetite has been the same. Denies anhedonia. Has been reaching out to friends again and responding to texts and calls. Denies SI.   Has not been drinking wine regularly. Typically has one cup of coffee daily.   Parents are having marital difficulties. Pt is oldest of 5 children. Pt and her husband are getting ready to celebrate their 10 year anniversary. They have plans to celebrate and her in-laws are going to keep their children.     Past Psychiatric Medication Trials: Zoloft- Highest dose has been 100 mg and had some anxiety. Lexapro- took 20 mg po qd Wellbutrin Xl Deplin  Review of Systems:  Review of Systems  Musculoskeletal: Negative for gait problem.  Neurological: Negative for tremors.  Psychiatric/Behavioral:       Please refer to HPI    Medications: I have reviewed the patient's current medications.  Current Outpatient Medications  Medication Sig Dispense Refill  . acetaminophen (TYLENOL) 325 MG tablet Take 650 mg by mouth every 6 (six) hours as needed for mild pain, moderate pain or headache.    Marland Kitchen buPROPion (WELLBUTRIN XL) 150 MG 24 hr tablet TAKE 1 TABLET(150 MG) BY MOUTH DAILY 90 tablet 2  . Cholecalciferol (VITAMIN D) 125 MCG (5000 UT) CAPS Take by mouth.     . Copper (PARAGARD) IUD IUD 1 each by Intrauterine route once.    Marland Kitchen  olmesartan (BENICAR) 40 MG tablet Take 40 mg by mouth daily.    . sertraline (ZOLOFT) 50 MG tablet Take 1.5 tablets (75 mg total) by mouth daily. 135 tablet 2   No current facility-administered medications for this visit.    Medication Side Effects: None  Allergies:  Allergies  Allergen Reactions  . Amoxicillin Nausea And Vomiting    Has patient had a PCN reaction causing immediate rash, facial/tongue/throat swelling, SOB or lightheadedness with hypotension: No Has patient had a PCN reaction causing severe rash involving mucus membranes or skin necrosis: No Has patient had a PCN reaction that required hospitalization No Has patient had a PCN reaction occurring within the last 10 years: Yes If all of the above answers are "NO", then may proceed with Cephalosporin use.   . Codeine Nausea Only    Past Medical History:  Diagnosis Date  . Abnormal Pap smear 06/2011   ASCUS  . Depression   . Gestational hypertension 09/28/2016  . H/O varicella   . Hyperemesis arising during pregnancy 2013  . Hypertension   . Vaginal delivery 09/28/2016  . Vitamin D deficiency     Family History  Problem Relation Age of Onset  . ADD / ADHD Mother   . Depression Mother   . Alcohol abuse Father   . Prostate cancer Father   . Kidney disease Sister   . Alcohol abuse Sister   . Depression Sister   . ADD /  ADHD Sister   . Alcohol abuse Brother   . ADD / ADHD Brother   . Depression Maternal Aunt   . Depression Paternal Aunt   . Depression Paternal Grandmother     Social History   Socioeconomic History  . Marital status: Married    Spouse name: Not on file  . Number of children: Not on file  . Years of education: Not on file  . Highest education level: Not on file  Occupational History  . Not on file  Tobacco Use  . Smoking status: Never Smoker  . Smokeless tobacco: Never Used  Substance and Sexual Activity  . Alcohol use: Yes    Comment: 3 drinks per week  . Drug use: No  . Sexual  activity: Yes    Birth control/protection: None    Comment: approx 15 wks - stopped growing 8 wks ago  Other Topics Concern  . Not on file  Social History Narrative  . Not on file   Social Determinants of Health   Financial Resource Strain:   . Difficulty of Paying Living Expenses: Not on file  Food Insecurity:   . Worried About Charity fundraiser in the Last Year: Not on file  . Ran Out of Food in the Last Year: Not on file  Transportation Needs:   . Lack of Transportation (Medical): Not on file  . Lack of Transportation (Non-Medical): Not on file  Physical Activity:   . Days of Exercise per Week: Not on file  . Minutes of Exercise per Session: Not on file  Stress:   . Feeling of Stress : Not on file  Social Connections:   . Frequency of Communication with Friends and Family: Not on file  . Frequency of Social Gatherings with Friends and Family: Not on file  . Attends Religious Services: Not on file  . Active Member of Clubs or Organizations: Not on file  . Attends Archivist Meetings: Not on file  . Marital Status: Not on file  Intimate Partner Violence:   . Fear of Current or Ex-Partner: Not on file  . Emotionally Abused: Not on file  . Physically Abused: Not on file  . Sexually Abused: Not on file    Past Medical History, Surgical history, Social history, and Family history were reviewed and updated as appropriate.   Please see review of systems for further details on the patient's review from today.   Objective:   Physical Exam:  BP (!) 138/92   Pulse 74   Physical Exam Constitutional:      General: She is not in acute distress. Musculoskeletal:        General: No deformity.  Neurological:     Mental Status: She is alert and oriented to person, place, and time.     Coordination: Coordination normal.  Psychiatric:        Attention and Perception: Attention and perception normal. She does not perceive auditory or visual hallucinations.         Mood and Affect: Mood normal. Mood is not anxious or depressed. Affect is not labile, blunt, angry or inappropriate.        Speech: Speech normal.        Behavior: Behavior normal.        Thought Content: Thought content normal. Thought content is not paranoid or delusional. Thought content does not include homicidal or suicidal ideation. Thought content does not include homicidal or suicidal plan.  Cognition and Memory: Cognition and memory normal.        Judgment: Judgment normal.     Comments: Insight intact     Lab Review:     Component Value Date/Time   NA 137 09/30/2016 0539   K 3.7 09/30/2016 0539   CL 105 09/30/2016 0539   CO2 24 09/30/2016 0539   GLUCOSE 74 09/30/2016 0539   GLUCOSE 90 02/10/2012 0800   BUN 7 09/30/2016 0539   CREATININE 0.68 09/30/2016 0539   CALCIUM 7.0 (L) 09/30/2016 0539   PROT 5.0 (L) 09/30/2016 0539   ALBUMIN 2.1 (L) 09/30/2016 0539   AST 43 (H) 09/30/2016 0539   ALT 65 (H) 09/30/2016 0539   ALKPHOS 132 (H) 09/30/2016 0539   BILITOT 0.4 09/30/2016 0539   GFRNONAA >60 09/30/2016 0539   GFRAA >60 09/30/2016 0539       Component Value Date/Time   WBC 8.7 09/30/2016 0539   RBC 2.86 (L) 09/30/2016 0539   HGB 7.6 (L) 09/30/2016 0539   HCT 22.8 (L) 09/30/2016 0539   PLT 238 09/30/2016 0539   MCV 79.7 09/30/2016 0539   MCH 26.6 09/30/2016 0539   MCHC 33.3 09/30/2016 0539   RDW 14.3 09/30/2016 0539    No results found for: POCLITH, LITHIUM   No results found for: PHENYTOIN, PHENOBARB, VALPROATE, CBMZ   .res Assessment: Plan:   Will continue current plan of care since target signs and symptoms are well controlled without any tolerability issues. Continue sertraline 75 mg daily for anxiety and depression. Continue Wellbutrin XL 150 mg daily for depression. Patient to follow-up in 9 months or sooner if clinically indicated. Patient advised to contact office with any questions, adverse effects, or acute worsening in signs and  symptoms.  Susan Zamora was seen today for follow-up.  Diagnoses and all orders for this visit:  Mild episode of recurrent major depressive disorder ([facility]) -     buPROPion (WELLBUTRIN XL) 150 MG 24 hr tablet; TAKE 1 TABLET(150 MG) BY MOUTH DAILY  Premenstrual dysphoric disorder -     sertraline (ZOLOFT) 50 MG tablet; Take 1.5 tablets (75 mg total) by mouth daily.  Anxiety disorder, unspecified type -     sertraline (ZOLOFT) 50 MG tablet; Take 1.5 tablets (75 mg total) by mouth daily.     Please see After Visit Summary for patient specific instructions.  Future Appointments  Date Time Provider [place]  07/07/2020 10:00 AM Thayer Headings, PMHNP [facility] None    No orders of the defined types were placed in this encounter.   -------------------------------

## 2019-11-01 ENCOUNTER — Ambulatory Visit: Payer: PRIVATE HEALTH INSURANCE | Attending: Internal Medicine

## 2019-11-01 DIAGNOSIS — Z23 Encounter for immunization: Secondary | ICD-10-CM

## 2019-11-01 NOTE — Progress Notes (Signed)
   Covid-19 Vaccination Clinic  Name:  Susan Zamora    MRN: [id number] DOB: 02/26/1981  11/01/2019  [name] was observed post Covid-19 immunization for 15 minutes without incident. She was provided with Vaccine Information Sheet and instruction to access the V-Safe system.   [name] was instructed to call 911 with any severe reactions post vaccine: Marland Kitchen Difficulty breathing  . Swelling of face and throat  . A fast heartbeat  . A bad rash all over body  . Dizziness and weakness   Immunizations Administered    Name Date Dose VIS Date Route   Pfizer COVID-19 Vaccine 11/01/2019 11:11 AM 0.3 mL 07/12/2019 Intramuscular   Manufacturer: [employer], [employer]   Lot: [id number]   [place]: [id number]

## 2019-11-02 ENCOUNTER — Ambulatory Visit: Payer: PRIVATE HEALTH INSURANCE

## 2019-11-25 ENCOUNTER — Ambulatory Visit: Payer: PRIVATE HEALTH INSURANCE | Attending: Internal Medicine

## 2019-11-25 DIAGNOSIS — Z23 Encounter for immunization: Secondary | ICD-10-CM

## 2019-11-25 NOTE — Progress Notes (Signed)
   Covid-19 Vaccination Clinic  Name:  Susan Zamora    MRN: [id number] DOB: 13-Nov-1980  11/25/2019  [name] was observed post Covid-19 immunization for 15 minutes without incident. She was provided with Vaccine Information Sheet and instruction to access the V-Safe system.   [name] was instructed to call 911 with any severe reactions post vaccine: Marland Kitchen Difficulty breathing  . Swelling of face and throat  . A fast heartbeat  . A bad rash all over body  . Dizziness and weakness   Immunizations Administered    Name Date Dose VIS Date Route   Pfizer COVID-19 Vaccine 11/25/2019 12:36 PM 0.3 mL 09/25/2018 Intramuscular   Manufacturer: [place]   Lot: [id number]   [place]: [id number]

## 2020-05-26 ENCOUNTER — Other Ambulatory Visit: Payer: Self-pay | Admitting: Obstetrics and Gynecology

## 2020-05-26 DIAGNOSIS — E041 Nontoxic single thyroid nodule: Secondary | ICD-10-CM

## 2020-06-02 ENCOUNTER — Ambulatory Visit
Admission: RE | Admit: 2020-06-02 | Discharge: 2020-06-02 | Disposition: A | Discharge status: Home / Self Care | Payer: PRIVATE HEALTH INSURANCE | Source: Ambulatory Visit | Attending: Obstetrics and Gynecology | Admitting: Obstetrics and Gynecology

## 2020-06-02 DIAGNOSIS — E041 Nontoxic single thyroid nodule: Secondary | ICD-10-CM

## 2020-07-03 ENCOUNTER — Other Ambulatory Visit: Payer: Self-pay | Admitting: Otolaryngology

## 2020-07-03 DIAGNOSIS — E042 Nontoxic multinodular goiter: Secondary | ICD-10-CM

## 2020-07-07 ENCOUNTER — Ambulatory Visit (INDEPENDENT_AMBULATORY_CARE_PROVIDER_SITE_OTHER): Payer: PRIVATE HEALTH INSURANCE | Admitting: Psychiatry

## 2020-07-07 ENCOUNTER — Other Ambulatory Visit: Payer: Self-pay

## 2020-07-07 ENCOUNTER — Encounter: Payer: Self-pay | Admitting: Psychiatry

## 2020-07-07 DIAGNOSIS — F3281 Premenstrual dysphoric disorder: Secondary | ICD-10-CM

## 2020-07-07 DIAGNOSIS — F330 Major depressive disorder, recurrent, mild: Secondary | ICD-10-CM | POA: Diagnosis not present

## 2020-07-07 DIAGNOSIS — F419 Anxiety disorder, unspecified: Secondary | ICD-10-CM | POA: Diagnosis not present

## 2020-07-07 MED ORDER — BUPROPION HCL ER (XL) 150 MG PO TB24: ORAL_TABLET | ORAL | 11 refills | Status: DC

## 2020-07-07 MED ORDER — SERTRALINE HCL 50 MG PO TABS: 75.0000 mg | ORAL_TABLET | Freq: Every day | ORAL | 11 refills | Status: DC

## 2020-07-07 NOTE — Progress Notes (Signed)
Susan Zamora [id number] Apr 10, 1981 39 y.o.  Subjective:   Patient ID:  Susan Zamora is a 39 y.o. (DOB 1980/10/25) female.  Chief Complaint:  Chief Complaint  Patient presents with  . Follow-up    Anxiety and depression    HPI Susan Zamora presents to the office today for follow-up of h/o depression and anxiety. Denies any anxiety. She reports that her mood has been ok with "the normal ups and downs." Denies any significant depression. She reports that energy and motivation have been "pretty good." Notices change in energy and motivation around menstrual cycle and obgyn thinks that she could be perimenopausal. Notices some irritability during pre-menstrual phase and otherwise does not experience any irritability. Sleeping well. Appetite has been stable. Concentration is adequate. Denies anhedonia. Denies SI.   She reports occ ETOH use and that ETOH intake is minimal and in social situations.   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    Review of Systems:  Review of Systems  Endocrine:       Has had thyroid nodules and has biopsy scheduled for 07/14/20. Thyroid levels have been WNL.   Musculoskeletal: Negative for gait problem.  Neurological: Negative for tremors.  Psychiatric/Behavioral:       Please refer to HPI  Had abnormal mammogram and then had follow-up diagnostic mammogram that was normal.  Had labs drawn yesterday to include Vit D, Lipid panel, thyroid panel, and metabolic panel.     Medications: I have reviewed the patient's current medications.  Current Outpatient Medications  Medication Sig Dispense Refill  . buPROPion (WELLBUTRIN XL) 150 MG 24 hr tablet TAKE 1 TABLET(150 MG) BY MOUTH DAILY 30 tablet 11  . Cholecalciferol (VITAMIN D) 125 MCG (5000 UT) CAPS Take by mouth.     . Copper (PARAGARD) IUD IUD 1 each by Intrauterine route once.    Marland Kitchen olmesartan (BENICAR) 40 MG tablet Take 40 mg by mouth daily.    Marland Kitchen acetaminophen (TYLENOL) 325 MG tablet Take 650 mg by  mouth every 6 (six) hours as needed for mild pain, moderate pain or headache.    . sertraline (ZOLOFT) 50 MG tablet Take 1.5 tablets (75 mg total) by mouth daily. 45 tablet 11   No current facility-administered medications for this visit.    Medication Side Effects: None  Allergies:  Allergies  Allergen Reactions  . Amoxicillin Nausea And Vomiting    Has patient had a PCN reaction causing immediate rash, facial/tongue/throat swelling, SOB or lightheadedness with hypotension: No Has patient had a PCN reaction causing severe rash involving mucus membranes or skin necrosis: No Has patient had a PCN reaction that required hospitalization No Has patient had a PCN reaction occurring within the last 10 years: Yes If all of the above answers are "NO", then may proceed with Cephalosporin use.   . Codeine Nausea Only    Past Medical History:  Diagnosis Date  . Abnormal Pap smear 06/2011   ASCUS  . Depression   . Gestational hypertension 09/28/2016  . H/O varicella   . Hyperemesis arising during pregnancy 2013  . Hypertension   . Vaginal delivery 09/28/2016  . Vitamin D deficiency     Family History  Problem Relation Age of Onset  . ADD / ADHD Mother   . Depression Mother   . Alcohol abuse Father   . Prostate cancer Father   . Kidney disease Sister   . Alcohol abuse Sister   . Depression Sister   . ADD /  ADHD Sister   . Alcohol abuse Brother   . ADD / ADHD Brother   . Depression Maternal Aunt   . Depression Paternal Aunt   . Depression Paternal Grandmother     Social History   Socioeconomic History  . Marital status: Married    Spouse name: Not on file  . Number of children: Not on file  . Years of education: Not on file  . Highest education level: Not on file  Occupational History  . Not on file  Tobacco Use  . Smoking status: Never Smoker  . Smokeless tobacco: Never Used  Substance and Sexual Activity  . Alcohol use: Yes    Comment: 3 drinks per week  . Drug  use: No  . Sexual activity: Yes    Birth control/protection: None    Comment: approx 15 wks - stopped growing 8 wks ago  Other Topics Concern  . Not on file  Social History Narrative  . Not on file   Social Determinants of Health   Financial Resource Strain: Not on file  Food Insecurity: Not on file  Transportation Needs: Not on file  Physical Activity: Not on file  Stress: Not on file  Social Connections: Not on file  Intimate Partner Violence: Not on file    Past Medical History, Surgical history, Social history, and Family history were reviewed and updated as appropriate.   Please see review of systems for further details on the patient's review from today.   Objective:   Physical Exam:  There were no vitals taken for this visit.  Physical Exam Constitutional:      General: She is not in acute distress. Musculoskeletal:        General: No deformity.  Neurological:     Mental Status: She is alert and oriented to person, place, and time.     Coordination: Coordination normal.  Psychiatric:        Attention and Perception: Attention and perception normal. She does not perceive auditory or visual hallucinations.        Mood and Affect: Mood normal. Mood is not anxious or depressed. Affect is not labile, blunt, angry or inappropriate.        Speech: Speech normal.        Behavior: Behavior normal.        Thought Content: Thought content normal. Thought content is not paranoid or delusional. Thought content does not include homicidal or suicidal ideation. Thought content does not include homicidal or suicidal plan.        Cognition and Memory: Cognition and memory normal.        Judgment: Judgment normal.     Comments: Insight intact     Lab Review:     Component Value Date/Time   NA 137 09/30/2016 0539   K 3.7 09/30/2016 0539   CL 105 09/30/2016 0539   CO2 24 09/30/2016 0539   GLUCOSE 74 09/30/2016 0539   GLUCOSE 90 02/10/2012 0800   BUN 7 09/30/2016 0539    CREATININE 0.68 09/30/2016 0539   CALCIUM 7.0 (L) 09/30/2016 0539   PROT 5.0 (L) 09/30/2016 0539   ALBUMIN 2.1 (L) 09/30/2016 0539   AST 43 (H) 09/30/2016 0539   ALT 65 (H) 09/30/2016 0539   ALKPHOS 132 (H) 09/30/2016 0539   BILITOT 0.4 09/30/2016 0539   GFRNONAA >60 09/30/2016 0539   GFRAA >60 09/30/2016 0539       Component Value Date/Time   WBC 8.7 09/30/2016 0539   RBC  2.86 (L) 09/30/2016 0539   HGB 7.6 (L) 09/30/2016 0539   HCT 22.8 (L) 09/30/2016 0539   PLT 238 09/30/2016 0539   MCV 79.7 09/30/2016 0539   MCH 26.6 09/30/2016 0539   MCHC 33.3 09/30/2016 0539   RDW 14.3 09/30/2016 0539    No results found for: POCLITH, LITHIUM   No results found for: PHENYTOIN, PHENOBARB, VALPROATE, CBMZ   .res Assessment: Plan:   Will continue current plan of care since target signs and symptoms are well controlled without any tolerability issues. Continue Wellbutrin XL 150 mg po qd for depression. Continue Sertraline 75 mg po qd for depression and anxiety.  Pt to follow-up in one year or sooner if clinically indicated. Patient advised to contact office with any questions, adverse effects, or acute worsening in signs and symptoms.  Susan Zamora was seen today for follow-up.  Diagnoses and all orders for this visit:  Mild episode of recurrent major depressive disorder (HCC) -     buPROPion (WELLBUTRIN XL) 150 MG 24 hr tablet; TAKE 1 TABLET(150 MG) BY MOUTH DAILY  Premenstrual dysphoric disorder -     sertraline (ZOLOFT) 50 MG tablet; Take 1.5 tablets (75 mg total) by mouth daily.  Anxiety disorder, unspecified type -     sertraline (ZOLOFT) 50 MG tablet; Take 1.5 tablets (75 mg total) by mouth daily.     Please see After Visit Summary for patient specific instructions.  Future Appointments  Date Time Provider [place]  07/14/2020 10:30 AM [facility] [place] [facility]  07/15/2020  9:15 AM Renato Shin, MD [facility] None  07/07/2021 10:00 AM Thayer Headings,  PMHNP [facility] None    No orders of the defined types were placed in this encounter.   -------------------------------

## 2020-07-14 ENCOUNTER — Ambulatory Visit
Admission: RE | Admit: 2020-07-14 | Discharge: 2020-07-14 | Disposition: A | Discharge status: Home / Self Care | Payer: PRIVATE HEALTH INSURANCE | Source: Ambulatory Visit | Attending: Otolaryngology | Admitting: Otolaryngology

## 2020-07-14 ENCOUNTER — Other Ambulatory Visit (HOSPITAL_COMMUNITY)
Admission: RE | Admit: 2020-07-14 | Discharge: 2020-07-14 | Disposition: A | Discharge status: Home / Self Care | Payer: PRIVATE HEALTH INSURANCE | Source: Ambulatory Visit | Attending: Otolaryngology | Admitting: Otolaryngology

## 2020-07-14 DIAGNOSIS — E042 Nontoxic multinodular goiter: Secondary | ICD-10-CM | POA: Diagnosis present

## 2020-07-15 ENCOUNTER — Other Ambulatory Visit: Payer: Self-pay

## 2020-07-15 ENCOUNTER — Ambulatory Visit (INDEPENDENT_AMBULATORY_CARE_PROVIDER_SITE_OTHER): Payer: PRIVATE HEALTH INSURANCE | Admitting: Endocrinology

## 2020-07-15 ENCOUNTER — Encounter: Payer: Self-pay | Admitting: Endocrinology

## 2020-07-15 DIAGNOSIS — E042 Nontoxic multinodular goiter: Secondary | ICD-10-CM | POA: Diagnosis not present

## 2020-07-15 LAB — TSH: TSH: 0.64 u[IU]/mL (ref 0.35–4.50)

## 2020-07-15 LAB — T4, FREE: Free T4: 0.85 ng/dL (ref 0.60–1.60)

## 2020-07-15 NOTE — Progress Notes (Signed)
Subjective:    Patient ID: Susan Zamora, female    DOB: 09-04-1980, 39 y.o.   MRN: [id number]  HPI Pt is referred by Dr Mancel Bale, for nodular thyroid.  Pt was noted to have a nodule at the thyroid in 2021.  she is unaware of ever having had thyroid problems in the past.  she has no h/o XRT or surgery to the neck.  Pt is unaware of ever having had TFT.  She is not breast feeding.   Past Medical History:  Diagnosis Date  . Abnormal Pap smear 06/2011   ASCUS  . Depression   . Gestational hypertension 09/28/2016  . H/O varicella   . Hyperemesis arising during pregnancy 2013  . Hypertension   . Vaginal delivery 09/28/2016  . Vitamin D deficiency     Past Surgical History:  Procedure Laterality Date  . CESAREAN SECTION  04/26/2012   Procedure: CESAREAN SECTION;  Surgeon: Ena Dawley, MD;  Location: [facility];  Service: Obstetrics;  Laterality: N/A;  Primary Cesarean Section [address] @ [phone],  . DILATION AND EVACUATION N/A 09/02/2014   Procedure: DILATATION AND EVACUATION;  Surgeon: Delice Lesch, MD;  Location: [facility];  Service: Gynecology;  Laterality: N/A;  . IRRIGATION AND DEBRIDEMENT ABSCESS  05/29/2012   Procedure: IRRIGATION AND DEBRIDEMENT ABSCESS;  Surgeon: Edward Jolly, MD;  Location: [facility];  Service: General;  Laterality: Right;  . TONSILLECTOMY  1985   age 58  . WISDOM TOOTH EXTRACTION  1996   age 21    Social History   Socioeconomic History  . Marital status: Married    Spouse name: Not on file  . Number of children: Not on file  . Years of education: Not on file  . Highest education level: Not on file  Occupational History  . Not on file  Tobacco Use  . Smoking status: Never Smoker  . Smokeless tobacco: Never Used  Substance and Sexual Activity  . Alcohol use: Yes    Comment: 3 drinks per week  . Drug use: No  . Sexual activity: Yes    Birth control/protection: None    Comment: approx 15 wks - stopped growing 8 wks ago  Other Topics  Concern  . Not on file  Social History Narrative  . Not on file   Social Determinants of Health   Financial Resource Strain: Not on file  Food Insecurity: Not on file  Transportation Needs: Not on file  Physical Activity: Not on file  Stress: Not on file  Social Connections: Not on file  Intimate Partner Violence: Not on file    Current Outpatient Medications on File Prior to Visit  Medication Sig Dispense Refill  . buPROPion (WELLBUTRIN XL) 150 MG 24 hr tablet TAKE 1 TABLET(150 MG) BY MOUTH DAILY 30 tablet 11  . Cholecalciferol (VITAMIN D) 125 MCG (5000 UT) CAPS Take by mouth.     . Copper (PARAGARD) IUD IUD 1 each by Intrauterine route once.    Marland Kitchen olmesartan (BENICAR) 40 MG tablet Take 40 mg by mouth daily.    . sertraline (ZOLOFT) 50 MG tablet Take 1.5 tablets (75 mg total) by mouth daily. 45 tablet 11  . acetaminophen (TYLENOL) 325 MG tablet Take 650 mg by mouth every 6 (six) hours as needed for mild pain, moderate pain or headache. (Patient not taking: Reported on 07/15/2020)     No current facility-administered medications on file prior to visit.    Allergies  Allergen Reactions  .  Amoxicillin Nausea And Vomiting    Has patient had a PCN reaction causing immediate rash, facial/tongue/throat swelling, SOB or lightheadedness with hypotension: No Has patient had a PCN reaction causing severe rash involving mucus membranes or skin necrosis: No Has patient had a PCN reaction that required hospitalization No Has patient had a PCN reaction occurring within the last 10 years: Yes If all of the above answers are "NO", then may proceed with Cephalosporin use.   . Codeine Nausea Only    Family History  Problem Relation Age of Onset  . ADD / ADHD Mother   . Depression Mother   . Alcohol abuse Father   . Prostate cancer Father   . Kidney disease Sister   . Alcohol abuse Sister   . Depression Sister   . ADD / ADHD Sister   . Alcohol abuse Brother   . ADD / ADHD Brother   .  Depression Maternal Aunt   . Depression Paternal Aunt   . Depression Paternal Grandmother   . Thyroid disease Neg Hx     BP 124/74   Pulse 86   Ht 5\' 2"  (1.575 m)   Wt 172 lb (78 kg)   SpO2 98%   BMI 31.46 kg/m    Review of Systems Denies hoarseness, neck pain, sob, diarrhea, flushing, and cold intolerance.  She has weight gain.      Objective:   Physical Exam VITAL SIGNS:  See vs page GENERAL: no distress NECK: I can palpate [facility] thyroid nodules.   Lab Results  Component Value Date   TSH 0.64 07/15/2020    [place]: Heterogeneous multinodular goiter.  Left inferior thyroid nodule (labeled 4, 5.0 cm, TR 4) meets criteria for biopsy.   I have reviewed outside records, and summarized: Pt was noted to have MNG, and referred here.  This was incidentally noted on routing GYN visit.  thyroid needle bx: Location: thyroid consent obtained, signed form on chart The area is first sprayed with cooling agent local: xylocaine 2%, with epinephrine prep: alcohol pad 4 bxs are done with 25 and 42J needles no complications     Assessment & Plan:  Thyroid nodule, new to me.  uncertain etiology and prognosis.    Patient Instructions  Blood tests are requested for you today.  We'll let you know about the results, and of the biopsy, also.

## 2020-07-15 NOTE — Patient Instructions (Signed)
Blood tests are requested for you today.  We'll let you know about the results, and of the biopsy, also.

## 2020-07-16 LAB — CYTOLOGY - NON PAP

## 2020-08-01 DIAGNOSIS — U071 COVID-19: Secondary | ICD-10-CM

## 2020-08-01 HISTORY — DX: COVID-19: U07.1

## 2020-10-14 ENCOUNTER — Other Ambulatory Visit: Payer: Self-pay | Admitting: Obstetrics and Gynecology

## 2020-10-19 ENCOUNTER — Other Ambulatory Visit: Payer: Self-pay

## 2020-10-19 DIAGNOSIS — F3281 Premenstrual dysphoric disorder: Secondary | ICD-10-CM

## 2020-10-19 DIAGNOSIS — F419 Anxiety disorder, unspecified: Secondary | ICD-10-CM

## 2020-10-19 MED ORDER — SERTRALINE HCL 50 MG PO TABS: 75.0000 mg | ORAL_TABLET | Freq: Every day | ORAL | 11 refills | Status: DC

## 2020-12-08 NOTE — H&P (Addendum)
Susan Zamora is a 40 y.o. female   P: 3-0-1-3 who presents for sterilization and placement ot tension free vaginal tape because of her desire to cease childbearing and stress urinary incontinence. Since the vaginal birth of her three over 8 lb babies,  the patient states that she has had varying degrees of bladder leakage with coughing, sneezing  or laughing.  More recently however, she will leak urine with sudden movements and with exercise.  She denies dysuria, urgency, hematuria, changes in bowel function, dyspareunia or vaginitis symptoms.  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  The patient goes on to request permanent sterilization.  She currently uses the Paragard IUD (her second device) and has used the NuvaRing and Condoms in the past. Her menstrual flow is monthly for 5 days with the need to change her pad every 2 hours. Though she has cramping that she rates 7/10  she finds relief from Ibuprofen 600 mg.  Though her current IUD has been adequate and relatively maintenance  free,  she desires a permanent option.    The patient was given both medical and surgical management options for her bladder condition and contraception however,  she wishes to proceed with surgical management for both.   Past Medical History  OB History: G: 4,  P: 3-0-1-3: 2013-C-section and SVB: 2015 and 2018 (largest infant 8 lbs. 14 oz.)  GYN History: menarche: 40 YO; LMP: 11/12/2020;   Contraception: Paragard IUD;  Remote  history of abnormal PAP smear that was simply repeated and have been normal since.   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 Amoxicillin Nausea And Vomiting    Has patient had a PCN reaction causing immediate rash, facial/tongue/throat swelling, SOB or lightheadedness with hypotension: No Has patient had a PCN reaction causing severe rash involving mucus membranes or skin necrosis: No Has patient had a PCN reaction that required hospitalization No Has patient had a PCN reaction occurring within the last 10 years: Yes If all of the above answers are "NO", then may proceed with Cephalosporin use.   . Codeine Nausea Only   Denies sensitivity to peanuts, shellfish, soy, latex or adhesives.  ROS:  Denies corrective lenses,  headache, vision changes, nasal congestion, dysphagia, tinnitus, dizziness, hoarseness, cough,  chest pain, shortness of breath, nausea, vomiting, diarrhea,constipation,  urinary frequency, urgency  dysuria, hematuria, vaginitis symptoms, pelvic pain, swelling of joints,easy bruising,  myalgias, arthralgias, skin rashes, unexplained weight loss and except as is mentioned in the history of present illness, patient's review of systems is otherwise negative.     Physical Exam  Bp: 100/60;  P: 70 bpm;  R: 16;  Temperature: 98.4 degrees F orally; Weight: 143 lbs.;  Height: 5'2.5";  BMI: 25.7;  O2Sat.: 99% (room air)  Neck: supple without masses or thyromegaly Lungs: clear to auscultation Heart: regular rate and rhythm Abdomen: soft,  non-tender and no organomegaly Pelvic:EGBUS- wnl; vagina-normal rugae; uterus-normal size, cervix without lesions or motion tenderness, IUD strings visible; adnexae-no tenderness or masses Extremities:  no clubbing, cyanosis or  edema   Assesment: Desire for Sterilization                       Mixed Urinary Incontinence   Disposition:  A discussion was held with patient regarding the indication for her procedure(s) along with the risks, which include but are not limited to: reaction to anesthesia, damage to adjacent organs (including ureters, bladder and rectum), infection,  excessive bleeding erosion of tension free vaginal tape and worsening of symptoms.  The patient verbalized understanding of these risks and has consented to proceed with Bilateral Salpingectomy, Placement of Tension Free Vaginal Tape, Cystoscopy, Removal of Paragard IUD and Possible Anterior-Posterior Colporrhaphy at [facility] on Dec 16, 2020.   CSN# [id number]   Susan J. Florene Glen, PA-C  for Dr. Harvie Zamora. Susan Zamora  Reviewed planned procedure - laparoscopic bilateral salpingectomy, removal of IUD, anterior and posterior colporrhaphy, tension free vaginal tape and cystoscopy.  Risks benefits and alternatives reviewed including but not limited to bleeding infection and injury.  Pt also has pelvic relaxation with cystocele and rectocele and desires repair which I discussed with the patient in detail after patient had preop appointment with Susan Regal, PA-C.  Questions answered and consent signed and witnessed.

## 2020-12-10 ENCOUNTER — Other Ambulatory Visit: Payer: Self-pay

## 2020-12-10 ENCOUNTER — Encounter (HOSPITAL_BASED_OUTPATIENT_CLINIC_OR_DEPARTMENT_OTHER): Payer: Self-pay | Admitting: Obstetrics and Gynecology

## 2020-12-10 NOTE — Progress Notes (Signed)
Spoke w/ via phone for pre-op interview---pt Lab needs dos---- urine preg              Lab results------has lab appt 12-14-2020 930 am for cbc bmp, ekg COVID test ------12-15-2020 930 Arrive at -------1030 am 12-16-2020 NPO after MN NO Solid Food.  Clear liquids from MN until---930 am then npo Med rec completed Medications to take morning of surgery -----bupropion, sertraline Diabetic medication -----n/a Patient instructed to bring photo id and insurance card day of surgery Patient aware to have Driver (ride ) / caregive spouse Chrissie Noa will stay    for 24 hours after surgery  Patient Special Instructions -----pt states she is overnight stay per dr Mancel Bale, pt given overnight stay instructions Pre-Op special Istructions -----none Patient verbalized understanding of instructions that were given at this phone interview. Patient denies shortness of breath, chest pain, fever, cough at this phone interview.

## 2020-12-14 ENCOUNTER — Other Ambulatory Visit (HOSPITAL_COMMUNITY)
Admission: RE | Admit: 2020-12-14 | Discharge: 2020-12-14 | Disposition: A | Discharge status: Home / Self Care | Payer: PRIVATE HEALTH INSURANCE | Source: Ambulatory Visit | Attending: Obstetrics and Gynecology | Admitting: Obstetrics and Gynecology

## 2020-12-14 ENCOUNTER — Other Ambulatory Visit: Payer: Self-pay

## 2020-12-14 ENCOUNTER — Encounter (HOSPITAL_COMMUNITY)
Admission: RE | Admit: 2020-12-14 | Discharge: 2020-12-14 | Disposition: A | Discharge status: Home / Self Care | Payer: PRIVATE HEALTH INSURANCE | Source: Ambulatory Visit | Attending: Obstetrics and Gynecology | Admitting: Obstetrics and Gynecology

## 2020-12-14 DIAGNOSIS — Z01818 Encounter for other preprocedural examination: Secondary | ICD-10-CM | POA: Diagnosis not present

## 2020-12-14 DIAGNOSIS — Z20822 Contact with and (suspected) exposure to covid-19: Secondary | ICD-10-CM | POA: Insufficient documentation

## 2020-12-14 DIAGNOSIS — Z01812 Encounter for preprocedural laboratory examination: Secondary | ICD-10-CM | POA: Insufficient documentation

## 2020-12-14 LAB — BASIC METABOLIC PANEL
Anion gap: 3 — ABNORMAL LOW (ref 5–15)
BUN: 15 mg/dL (ref 6–20)
CO2: 27 mmol/L (ref 22–32)
Calcium: 9.2 mg/dL (ref 8.9–10.3)
Chloride: 109 mmol/L (ref 98–111)
Creatinine, Ser: 0.89 mg/dL (ref 0.44–1.00)
GFR, Estimated: >60 mL/min (ref >60)
Glucose, Bld: 98 mg/dL (ref 70–99)
Potassium: 4.2 mmol/L (ref 3.5–5.1)
Sodium: 139 mmol/L (ref 135–145)

## 2020-12-14 LAB — CBC
HCT: 38.1 % (ref 36.0–46.0)
Hemoglobin: 12.5 g/dL (ref 12.0–15.0)
MCH: 30.6 pg (ref 26.0–34.0)
MCHC: 32.8 g/dL (ref 30.0–36.0)
MCV: 93.4 fL (ref 80.0–100.0)
Platelets: 221 10*3/uL (ref 150–400)
RBC: 4.08 MIL/uL (ref 3.87–5.11)
RDW: 12.6 % (ref 11.5–15.5)
WBC: 4.5 10*3/uL (ref 4.0–10.5)
nRBC: 0 % (ref 0.0–0.2)

## 2020-12-15 ENCOUNTER — Other Ambulatory Visit (HOSPITAL_COMMUNITY): Payer: PRIVATE HEALTH INSURANCE

## 2020-12-15 LAB — SARS CORONAVIRUS 2 (TAT 6-24 HRS): SARS Coronavirus 2: NEGATIVE

## 2020-12-16 ENCOUNTER — Encounter (HOSPITAL_BASED_OUTPATIENT_CLINIC_OR_DEPARTMENT_OTHER)
Admission: RE | Disposition: A | Discharge status: Home / Self Care | Payer: Self-pay | Source: Home / Self Care | Attending: Obstetrics and Gynecology

## 2020-12-16 ENCOUNTER — Ambulatory Visit (HOSPITAL_BASED_OUTPATIENT_CLINIC_OR_DEPARTMENT_OTHER): Payer: PRIVATE HEALTH INSURANCE | Admitting: Certified Registered Nurse Anesthetist

## 2020-12-16 ENCOUNTER — Observation Stay (HOSPITAL_BASED_OUTPATIENT_CLINIC_OR_DEPARTMENT_OTHER)
Admission: RE | Admit: 2020-12-16 | Discharge: 2020-12-17 | Disposition: A | Discharge status: Home / Self Care | Payer: PRIVATE HEALTH INSURANCE | Attending: Obstetrics and Gynecology | Admitting: Obstetrics and Gynecology

## 2020-12-16 ENCOUNTER — Encounter (HOSPITAL_BASED_OUTPATIENT_CLINIC_OR_DEPARTMENT_OTHER): Payer: Self-pay | Admitting: Obstetrics and Gynecology

## 2020-12-16 DIAGNOSIS — N838 Other noninflammatory disorders of ovary, fallopian tube and broad ligament: Secondary | ICD-10-CM | POA: Diagnosis not present

## 2020-12-16 DIAGNOSIS — Z302 Encounter for sterilization: Secondary | ICD-10-CM | POA: Diagnosis not present

## 2020-12-16 DIAGNOSIS — N816 Rectocele: Secondary | ICD-10-CM | POA: Diagnosis not present

## 2020-12-16 DIAGNOSIS — Z79899 Other long term (current) drug therapy: Secondary | ICD-10-CM | POA: Insufficient documentation

## 2020-12-16 DIAGNOSIS — I10 Essential (primary) hypertension: Secondary | ICD-10-CM | POA: Diagnosis not present

## 2020-12-16 DIAGNOSIS — N393 Stress incontinence (female) (male): Secondary | ICD-10-CM | POA: Diagnosis not present

## 2020-12-16 DIAGNOSIS — N39498 Other specified urinary incontinence: Secondary | ICD-10-CM | POA: Insufficient documentation

## 2020-12-16 DIAGNOSIS — N8110 Cystocele, unspecified: Secondary | ICD-10-CM | POA: Diagnosis not present

## 2020-12-16 HISTORY — PX: IUD REMOVAL: SHX5392

## 2020-12-16 HISTORY — PX: ANTERIOR (CYSTOCELE) AND POSTERIOR REPAIR (RECTOCELE) WITH XENFORM GRAFT AND SACROSPINOUS FIXATION: SHX6492

## 2020-12-16 HISTORY — DX: Anxiety disorder, unspecified: F41.9

## 2020-12-16 HISTORY — PX: LAPAROSCOPIC BILATERAL SALPINGECTOMY: SHX5889

## 2020-12-16 HISTORY — PX: CYSTOSCOPY: SHX5120

## 2020-12-16 HISTORY — PX: BLADDER SUSPENSION: SHX72

## 2020-12-16 LAB — POCT PREGNANCY, URINE: Preg Test, Ur: NEGATIVE

## 2020-12-16 SURGERY — SALPINGECTOMY, BILATERAL, LAPAROSCOPIC
Anesthesia: General | Site: Vagina

## 2020-12-16 MED ORDER — KETOROLAC TROMETHAMINE 30 MG/ML IJ SOLN
INTRAMUSCULAR | Status: AC
  Filled 2020-12-16: qty 1

## 2020-12-16 MED ORDER — ONDANSETRON HCL 4 MG/2ML IJ SOLN
INTRAMUSCULAR | Status: DC | PRN
  Administered 2020-12-16: 4 mg via INTRAVENOUS

## 2020-12-16 MED ORDER — ACETAMINOPHEN 325 MG PO TABS
ORAL_TABLET | ORAL | Status: DC | PRN
  Administered 2020-12-16: 1000 mg via ORAL

## 2020-12-16 MED ORDER — ROCURONIUM BROMIDE 10 MG/ML (PF) SYRINGE
PREFILLED_SYRINGE | INTRAVENOUS | Status: DC | PRN
  Administered 2020-12-16: 20 mg via INTRAVENOUS
  Administered 2020-12-16: 10 mg via INTRAVENOUS
  Administered 2020-12-16: 20 mg via INTRAVENOUS
  Administered 2020-12-16: 70 mg via INTRAVENOUS
  Administered 2020-12-16: 10 mg via INTRAVENOUS

## 2020-12-16 MED ORDER — SIMETHICONE 80 MG PO CHEW: 80.0000 mg | CHEWABLE_TABLET | Freq: Four times a day (QID) | ORAL | Status: DC | PRN

## 2020-12-16 MED ORDER — MENTHOL 3 MG MT LOZG: 1.0000 | LOZENGE | OROMUCOSAL | Status: DC | PRN

## 2020-12-16 MED ORDER — SODIUM CHLORIDE 0.9 % IR SOLN
Status: DC | PRN
  Administered 2020-12-16: 1000 mL via INTRAVESICAL

## 2020-12-16 MED ORDER — MEPERIDINE HCL 25 MG/ML IJ SOLN: 6.2500 mg | INTRAMUSCULAR | Status: DC | PRN

## 2020-12-16 MED ORDER — ACETAMINOPHEN 325 MG PO TABS: 325.0000 mg | ORAL_TABLET | ORAL | Status: DC | PRN

## 2020-12-16 MED ORDER — MIDAZOLAM HCL 5 MG/5ML IJ SOLN
INTRAMUSCULAR | Status: DC | PRN
  Administered 2020-12-16: 2 mg via INTRAVENOUS

## 2020-12-16 MED ORDER — LIDOCAINE 2% (20 MG/ML) 5 ML SYRINGE
INTRAMUSCULAR | Status: AC
  Filled 2020-12-16: qty 5

## 2020-12-16 MED ORDER — EPHEDRINE SULFATE-NACL 50-0.9 MG/10ML-% IV SOSY
PREFILLED_SYRINGE | INTRAVENOUS | Status: DC | PRN
  Administered 2020-12-16 (×3): 5 mg via INTRAVENOUS

## 2020-12-16 MED ORDER — FENTANYL CITRATE (PF) 250 MCG/5ML IJ SOLN
INTRAMUSCULAR | Status: AC
  Filled 2020-12-16: qty 5

## 2020-12-16 MED ORDER — OXYCODONE HCL 5 MG/5ML PO SOLN
5.0000 mg | Freq: Once | ORAL | Status: DC | PRN
Start: 2020-12-16 — End: 2020-12-17

## 2020-12-16 MED ORDER — KETOROLAC TROMETHAMINE 30 MG/ML IJ SOLN
30.0000 mg | Freq: Four times a day (QID) | INTRAMUSCULAR | Status: DC
  Administered 2020-12-16 – 2020-12-17 (×2): 30 mg via INTRAVENOUS

## 2020-12-16 MED ORDER — SODIUM CHLORIDE 0.9 % IV SOLN
25.0000 mg | Freq: Four times a day (QID) | INTRAVENOUS | Status: DC | PRN
  Administered 2020-12-16 – 2020-12-17 (×2): 25 mg via INTRAVENOUS
  Filled 2020-12-16 (×2): qty 25

## 2020-12-16 MED ORDER — DOCUSATE SODIUM 100 MG PO CAPS
100.0000 mg | ORAL_CAPSULE | Freq: Two times a day (BID) | ORAL | Status: DC
  Administered 2020-12-16: 100 mg via ORAL

## 2020-12-16 MED ORDER — BUPIVACAINE HCL (PF) 0.25 % IJ SOLN
INTRAMUSCULAR | Status: DC | PRN
  Administered 2020-12-16: 10 mL

## 2020-12-16 MED ORDER — ROCURONIUM BROMIDE 10 MG/ML (PF) SYRINGE
PREFILLED_SYRINGE | INTRAVENOUS | Status: AC
  Filled 2020-12-16: qty 10

## 2020-12-16 MED ORDER — ALBUMIN HUMAN 5 % IV SOLN: INTRAVENOUS | Status: DC | PRN

## 2020-12-16 MED ORDER — PROPOFOL 10 MG/ML IV BOLUS
INTRAVENOUS | Status: AC
  Filled 2020-12-16: qty 20

## 2020-12-16 MED ORDER — EPHEDRINE 5 MG/ML INJ
INTRAVENOUS | Status: AC
  Filled 2020-12-16: qty 10

## 2020-12-16 MED ORDER — 0.9 % SODIUM CHLORIDE (POUR BTL) OPTIME
TOPICAL | Status: DC | PRN
  Administered 2020-12-16: 500 mL

## 2020-12-16 MED ORDER — ONDANSETRON HCL 4 MG/2ML IJ SOLN
4.0000 mg | Freq: Four times a day (QID) | INTRAMUSCULAR | Status: DC | PRN
  Administered 2020-12-17: 4 mg via INTRAVENOUS

## 2020-12-16 MED ORDER — ONDANSETRON HCL 4 MG/2ML IJ SOLN
4.0000 mg | Freq: Once | INTRAMUSCULAR | Status: AC | PRN
  Administered 2020-12-16: 4 mg via INTRAVENOUS

## 2020-12-16 MED ORDER — ACETAMINOPHEN 160 MG/5ML PO SOLN: 325.0000 mg | ORAL | Status: DC | PRN

## 2020-12-16 MED ORDER — PROMETHAZINE HCL 25 MG/ML IJ SOLN: 25.0000 mg | Freq: Four times a day (QID) | INTRAMUSCULAR | Status: DC | PRN

## 2020-12-16 MED ORDER — KETOROLAC TROMETHAMINE 30 MG/ML IJ SOLN
30.0000 mg | Freq: Once | INTRAMUSCULAR | Status: AC
  Administered 2020-12-16: 30 mg via INTRAVENOUS

## 2020-12-16 MED ORDER — LACTATED RINGERS IV SOLN: INTRAVENOUS | Status: DC

## 2020-12-16 MED ORDER — PROPOFOL 10 MG/ML IV BOLUS
INTRAVENOUS | Status: DC | PRN
  Administered 2020-12-16: 200 mg via INTRAVENOUS
  Administered 2020-12-16: 20 mg via INTRAVENOUS

## 2020-12-16 MED ORDER — PHENYLEPHRINE HCL (PRESSORS) 10 MG/ML IV SOLN
INTRAVENOUS | Status: AC
  Filled 2020-12-16: qty 1

## 2020-12-16 MED ORDER — GENTAMICIN SULFATE 40 MG/ML IJ SOLN
5.0000 mg/kg | INTRAVENOUS | Status: AC
  Administered 2020-12-16: 280 mg via INTRAVENOUS
  Filled 2020-12-16: qty 7

## 2020-12-16 MED ORDER — ONDANSETRON HCL 4 MG/2ML IJ SOLN
INTRAMUSCULAR | Status: AC
  Filled 2020-12-16: qty 2

## 2020-12-16 MED ORDER — MIDAZOLAM HCL 2 MG/2ML IJ SOLN
INTRAMUSCULAR | Status: AC
  Filled 2020-12-16: qty 2

## 2020-12-16 MED ORDER — PHENYLEPHRINE 40 MCG/ML (10ML) SYRINGE FOR IV PUSH (FOR BLOOD PRESSURE SUPPORT)
PREFILLED_SYRINGE | INTRAVENOUS | Status: DC | PRN
  Administered 2020-12-16: 80 ug via INTRAVENOUS
  Administered 2020-12-16: 40 ug via INTRAVENOUS
  Administered 2020-12-16: 80 ug via INTRAVENOUS
  Administered 2020-12-16: 40 ug via INTRAVENOUS
  Administered 2020-12-16 (×3): 80 ug via INTRAVENOUS
  Administered 2020-12-16: 40 ug via INTRAVENOUS
  Administered 2020-12-16: 80 ug via INTRAVENOUS
  Administered 2020-12-16: 40 ug via INTRAVENOUS

## 2020-12-16 MED ORDER — CLINDAMYCIN PHOSPHATE 900 MG/50ML IV SOLN
900.0000 mg | INTRAVENOUS | Status: AC
  Administered 2020-12-16: 900 mg via INTRAVENOUS

## 2020-12-16 MED ORDER — ONDANSETRON HCL 4 MG PO TABS: 4.0000 mg | ORAL_TABLET | Freq: Four times a day (QID) | ORAL | Status: DC | PRN

## 2020-12-16 MED ORDER — DEXAMETHASONE SODIUM PHOSPHATE 10 MG/ML IJ SOLN
INTRAMUSCULAR | Status: AC
  Filled 2020-12-16: qty 1

## 2020-12-16 MED ORDER — DEXAMETHASONE SODIUM PHOSPHATE 10 MG/ML IJ SOLN
INTRAMUSCULAR | Status: DC | PRN
  Administered 2020-12-16: 10 mg via INTRAVENOUS

## 2020-12-16 MED ORDER — IRBESARTAN 300 MG PO TABS
300.0000 mg | ORAL_TABLET | Freq: Every day | ORAL | Status: DC
  Filled 2020-12-16: qty 1

## 2020-12-16 MED ORDER — KETOROLAC TROMETHAMINE 30 MG/ML IJ SOLN: 30.0000 mg | Freq: Once | INTRAMUSCULAR | Status: AC | PRN

## 2020-12-16 MED ORDER — SERTRALINE HCL 50 MG PO TABS
75.0000 mg | ORAL_TABLET | Freq: Every day | ORAL | Status: DC
  Filled 2020-12-16: qty 1

## 2020-12-16 MED ORDER — POVIDONE-IODINE 10 % EX SWAB: 2.0000 "application " | Freq: Once | CUTANEOUS | Status: DC

## 2020-12-16 MED ORDER — PANTOPRAZOLE SODIUM 40 MG PO TBEC
40.0000 mg | DELAYED_RELEASE_TABLET | Freq: Every day | ORAL | Status: AC
  Administered 2020-12-16: 40 mg via ORAL

## 2020-12-16 MED ORDER — OXYCODONE-ACETAMINOPHEN 5-325 MG PO TABS
1.0000 | ORAL_TABLET | ORAL | Status: DC | PRN
  Administered 2020-12-17: 1 via ORAL

## 2020-12-16 MED ORDER — ORAL CARE MOUTH RINSE: 15.0000 mL | Freq: Once | OROMUCOSAL | Status: DC

## 2020-12-16 MED ORDER — CHLORHEXIDINE GLUCONATE 0.12 % MT SOLN: 15.0000 mL | Freq: Once | OROMUCOSAL | Status: DC

## 2020-12-16 MED ORDER — HYDROMORPHONE HCL 1 MG/ML IJ SOLN
INTRAMUSCULAR | Status: AC
  Filled 2020-12-16: qty 1

## 2020-12-16 MED ORDER — HYDROMORPHONE HCL 1 MG/ML IJ SOLN: 1.0000 mg | INTRAMUSCULAR | Status: DC | PRN

## 2020-12-16 MED ORDER — CLINDAMYCIN PHOSPHATE 900 MG/50ML IV SOLN
INTRAVENOUS | Status: AC
  Filled 2020-12-16: qty 50

## 2020-12-16 MED ORDER — ESTRADIOL 0.1 MG/GM VA CREA
TOPICAL_CREAM | VAGINAL | Status: DC | PRN
  Administered 2020-12-16: 1 via VAGINAL

## 2020-12-16 MED ORDER — HYDROMORPHONE HCL 2 MG/ML IJ SOLN
INTRAMUSCULAR | Status: AC
  Filled 2020-12-16: qty 1

## 2020-12-16 MED ORDER — ACETAMINOPHEN 500 MG PO TABS
ORAL_TABLET | ORAL | Status: AC
  Filled 2020-12-16: qty 2

## 2020-12-16 MED ORDER — BUPROPION HCL ER (XL) 150 MG PO TB24
150.0000 mg | ORAL_TABLET | Freq: Every day | ORAL | Status: DC
  Filled 2020-12-16: qty 1

## 2020-12-16 MED ORDER — PHENYLEPHRINE HCL-NACL 10-0.9 MG/250ML-% IV SOLN
INTRAVENOUS | Status: DC | PRN
  Administered 2020-12-16: 50 ug/min via INTRAVENOUS

## 2020-12-16 MED ORDER — PHENYLEPHRINE 40 MCG/ML (10ML) SYRINGE FOR IV PUSH (FOR BLOOD PRESSURE SUPPORT)
PREFILLED_SYRINGE | INTRAVENOUS | Status: AC
  Filled 2020-12-16: qty 10

## 2020-12-16 MED ORDER — VASOPRESSIN 20 UNIT/ML IV SOLN
INTRAVENOUS | Status: DC | PRN
  Administered 2020-12-16: 35 mL via INTRAMUSCULAR

## 2020-12-16 MED ORDER — LIDOCAINE 2% (20 MG/ML) 5 ML SYRINGE
INTRAMUSCULAR | Status: DC | PRN
  Administered 2020-12-16: 80 mg via INTRAVENOUS

## 2020-12-16 MED ORDER — FENTANYL CITRATE (PF) 100 MCG/2ML IJ SOLN
INTRAMUSCULAR | Status: AC
  Filled 2020-12-16: qty 2

## 2020-12-16 MED ORDER — PHENYLEPHRINE 40 MCG/ML (10ML) SYRINGE FOR IV PUSH (FOR BLOOD PRESSURE SUPPORT)
PREFILLED_SYRINGE | INTRAVENOUS | Status: AC
  Filled 2020-12-16: qty 20

## 2020-12-16 MED ORDER — DOCUSATE SODIUM 100 MG PO CAPS
ORAL_CAPSULE | ORAL | Status: AC
  Filled 2020-12-16: qty 1

## 2020-12-16 MED ORDER — OXYCODONE HCL 5 MG PO TABS: 5.0000 mg | ORAL_TABLET | Freq: Once | ORAL | Status: DC | PRN

## 2020-12-16 MED ORDER — FENTANYL CITRATE (PF) 250 MCG/5ML IJ SOLN
INTRAMUSCULAR | Status: DC | PRN
  Administered 2020-12-16 (×4): 50 ug via INTRAVENOUS
  Administered 2020-12-16: 100 ug via INTRAVENOUS

## 2020-12-16 MED ORDER — FENTANYL CITRATE (PF) 100 MCG/2ML IJ SOLN: 25.0000 ug | INTRAMUSCULAR | Status: DC | PRN

## 2020-12-16 MED ORDER — HYDROMORPHONE HCL 1 MG/ML IJ SOLN
INTRAMUSCULAR | Status: DC | PRN
  Administered 2020-12-16: 1 mg via INTRAVENOUS

## 2020-12-16 MED ORDER — SUGAMMADEX SODIUM 200 MG/2ML IV SOLN
INTRAVENOUS | Status: DC | PRN
  Administered 2020-12-16: 200 mg via INTRAVENOUS

## 2020-12-16 MED ORDER — PANTOPRAZOLE SODIUM 40 MG PO TBEC
DELAYED_RELEASE_TABLET | ORAL | Status: AC
  Filled 2020-12-16: qty 1

## 2020-12-16 SURGICAL SUPPLY — 74 items
ADH SKN CLS APL DERMABOND .7 (GAUZE/BANDAGES/DRESSINGS) ×6
APL PRP STRL LF DISP 70% ISPRP (MISCELLANEOUS)
APL SRG 38 LTWT LNG FL B (MISCELLANEOUS)
APPLICATOR ARISTA FLEXITIP XL (MISCELLANEOUS) IMPLANT
BAG DRN RND TRDRP ANRFLXCHMBR (UROLOGICAL SUPPLIES) ×6
BAG SPEC RTRVL LRG 6X4 10 (ENDOMECHANICALS)
BAG URINE DRAIN 2000ML AR STRL (UROLOGICAL SUPPLIES) ×7 IMPLANT
BLADE SURG 11 STRL SS (BLADE) ×7 IMPLANT
BLADE SURG 15 STRL LF DISP TIS (BLADE) ×6 IMPLANT
BLADE SURG 15 STRL SS (BLADE) ×7
CABLE HIGH FREQUENCY MONO STRZ (ELECTRODE) IMPLANT
CATH FOLEY 2WAY SLVR  5CC 16FR (CATHETERS) ×14
CATH FOLEY 2WAY SLVR  5CC 18FR (CATHETERS) ×7
CATH FOLEY 2WAY SLVR 5CC 16FR (CATHETERS) ×7 IMPLANT
CATH FOLEY 2WAY SLVR 5CC 18FR (CATHETERS) ×6 IMPLANT
CATH ROBINSON RED A/P 16FR (CATHETERS) ×9 IMPLANT
CHLORAPREP W/TINT 26 (MISCELLANEOUS) IMPLANT
COVER MAYO STAND STRL (DRAPES) ×7 IMPLANT
COVER WAND RF STERILE (DRAPES) ×7 IMPLANT
DECANTER SPIKE VIAL GLASS SM (MISCELLANEOUS) IMPLANT
DERMABOND ADVANCED (GAUZE/BANDAGES/DRESSINGS) ×1
DERMABOND ADVANCED .7 DNX12 (GAUZE/BANDAGES/DRESSINGS) ×6 IMPLANT
DILATOR CANAL MILEX (MISCELLANEOUS) IMPLANT
DRAPE STERI URO 9X17 APER PCH (DRAPES) ×7 IMPLANT
DRSG OPSITE POSTOP 3X4 (GAUZE/BANDAGES/DRESSINGS) ×7 IMPLANT
DURAPREP 26ML APPLICATOR (WOUND CARE) ×7 IMPLANT
FORCEPS CUTTING 45CM 5MM (CUTTING FORCEPS) ×7 IMPLANT
GAUZE 4X4 16PLY RFD (DISPOSABLE) ×2 IMPLANT
GAUZE PACKING 1 X5 YD ST (GAUZE/BANDAGES/DRESSINGS) IMPLANT
GAUZE PACKING 2X5 YD STRL (GAUZE/BANDAGES/DRESSINGS) ×2 IMPLANT
GLOVE SURG ENC MOIS LTX SZ7 (GLOVE) ×7 IMPLANT
GLOVE SURG ENC MOIS LTX SZ7.5 (GLOVE) ×7 IMPLANT
GLOVE SURG LTX SZ6.5 (GLOVE) ×7 IMPLANT
GLOVE SURG UNDER POLY LF SZ7 (GLOVE) ×14 IMPLANT
GLOVE SURG UNDER POLY LF SZ7.5 (GLOVE) ×14 IMPLANT
GOWN STRL REUS W/TWL LRG LVL3 (GOWN DISPOSABLE) ×14 IMPLANT
HEMOSTAT ARISTA ABSORB 3G PWDR (HEMOSTASIS) IMPLANT
HIBICLENS CHG 4% 4OZ (MISCELLANEOUS) ×7 IMPLANT
IV NS 1000ML (IV SOLUTION) ×7
IV NS 1000ML BAXH (IV SOLUTION) ×1 IMPLANT
KIT TURNOVER CYSTO (KITS) ×7 IMPLANT
LIGASURE VESSEL 5MM BLUNT TIP (ELECTROSURGICAL) ×2 IMPLANT
NEEDLE HYPO 22GX1.5 SAFETY (NEEDLE) ×7 IMPLANT
NEEDLE INSUFFLATION 120MM (ENDOMECHANICALS) ×7 IMPLANT
NS IRRIG 1000ML POUR BTL (IV SOLUTION) ×7 IMPLANT
NS IRRIG 500ML POUR BTL (IV SOLUTION) ×4 IMPLANT
PACK LAPAROSCOPY BASIN (CUSTOM PROCEDURE TRAY) ×7 IMPLANT
PACK TRENDGUARD 450 HYBRID PRO (MISCELLANEOUS) ×6 IMPLANT
PACK VAGINAL WOMENS (CUSTOM PROCEDURE TRAY) ×7 IMPLANT
POUCH SPECIMEN RETRIEVAL 10MM (ENDOMECHANICALS) IMPLANT
SET IRRIG Y TYPE TUR BLADDER L (SET/KITS/TRAYS/PACK) ×7 IMPLANT
SET SUCTION IRRIG HYDROSURG (IRRIGATION / IRRIGATOR) IMPLANT
SET TUBE SMOKE EVAC HIGH FLOW (TUBING) ×7 IMPLANT
SLING TRANS VAGINAL TAPE (Sling) ×1 IMPLANT
SLING UTERINE/ABD GYNECARE TVT (Sling) ×6 IMPLANT
SUT MNCRL AB 3-0 PS2 27 (SUTURE) ×5 IMPLANT
SUT VIC AB 2-0 CT1 (SUTURE) ×6 IMPLANT
SUT VIC AB 2-0 CT1 27 (SUTURE) ×28
SUT VIC AB 2-0 CT1 TAPERPNT 27 (SUTURE) ×4 IMPLANT
SUT VIC AB 2-0 SH 27 (SUTURE) ×35
SUT VIC AB 2-0 SH 27XBRD (SUTURE) ×10 IMPLANT
SUT VIC AB 4-0 PS2 18 (SUTURE) ×2 IMPLANT
SUT VICRYL 0 ENDOLOOP (SUTURE) IMPLANT
SUT VICRYL 0 TIES 12 18 (SUTURE) IMPLANT
SUT VICRYL 0 UR6 27IN ABS (SUTURE) ×7 IMPLANT
SYR 50ML LL SCALE MARK (SYRINGE) ×9 IMPLANT
TOWEL OR 17X26 10 PK STRL BLUE (TOWEL DISPOSABLE) ×7 IMPLANT
TRAY FOLEY W/BAG SLVR 14FR LF (SET/KITS/TRAYS/PACK) ×7 IMPLANT
TRENDGUARD 450 HYBRID PRO PACK (MISCELLANEOUS) ×7
TROCAR BLADELESS OPT 5 100 (ENDOMECHANICALS) ×7 IMPLANT
TROCAR XCEL NON-BLD 11X100MML (ENDOMECHANICALS) ×7 IMPLANT
TUBE CONNECTING 12X1/4 (SUCTIONS) ×2 IMPLANT
WARMER LAPAROSCOPE (MISCELLANEOUS) ×7 IMPLANT
YANKAUER SUCT BULB TIP NO VENT (SUCTIONS) ×2 IMPLANT

## 2020-12-16 NOTE — Transfer of Care (Signed)
Immediate Anesthesia Transfer of Care Note  Patient: Susan Zamora  Procedure(s) Performed: LAPAROSCOPIC BILATERAL SALPINGECTOMY (Bilateral Abdomen) TRANSVAGINAL TAPE (TVT) PROCEDURE (N/A Vagina ) CYSTOSCOPY (N/A Bladder) INTRAUTERINE DEVICE (IUD) REMOVAL (Uterus) ANTERIOR (CYSTOCELE) AND POSTERIOR REPAIR (RECTOCELE) (Vagina )  Patient Location: PACU  Anesthesia Type:General  Level of Consciousness: drowsy, patient cooperative and responds to stimulation  Airway & Oxygen Therapy: Patient Spontanous Breathing and Patient connected to face mask oxygen  Post-op Assessment: Report given to RN and Post -op Vital signs reviewed and stable  Post vital signs: Reviewed and stable  Last Vitals:  Vitals Value Taken Time  BP 114/70 12/16/20 1735  Temp    Pulse 86 12/16/20 1739  Resp 19 12/16/20 1739  SpO2 100 % 12/16/20 1739  Vitals shown include unvalidated device data.  Last Pain:  Vitals:   12/16/20 1116  PainSc: 0-No pain      Patients Stated Pain Goal: 7 (94/70/96 2836)  Complications: No complications documented.

## 2020-12-16 NOTE — Anesthesia Postprocedure Evaluation (Signed)
Anesthesia Post Note  Patient: Susan Zamora  Procedure(s) Performed: LAPAROSCOPIC BILATERAL SALPINGECTOMY (Bilateral Abdomen) TRANSVAGINAL TAPE (TVT) PROCEDURE (N/A Vagina ) CYSTOSCOPY (N/A Bladder) INTRAUTERINE DEVICE (IUD) REMOVAL (Uterus) ANTERIOR (CYSTOCELE) AND POSTERIOR REPAIR (RECTOCELE) (Vagina )     Patient location during evaluation: PACU Anesthesia Type: General Level of consciousness: awake and alert Pain management: pain level controlled Vital Signs Assessment: post-procedure vital signs reviewed and stable Respiratory status: spontaneous breathing, nonlabored ventilation, respiratory function stable and patient connected to nasal cannula oxygen Cardiovascular status: blood pressure returned to baseline and stable Postop Assessment: no apparent nausea or vomiting Anesthetic complications: no   No complications documented.  Last Vitals:  Vitals:   12/16/20 1852 12/16/20 1900  BP:  (!) 95/57  Pulse: 89 80  Resp: 20 (!) 21  Temp:    SpO2: 100% 100%    Last Pain:  Vitals:   12/16/20 1900  TempSrc:   PainSc: 0-No pain                 Lindzie Boxx S

## 2020-12-16 NOTE — Op Note (Signed)
Marland KitchenPreop Diagnosis: 1.Desires Surgical Sterilization 2.Stress Urinary Incontinence 3.Cystocele 4.Rectocele 5.IUD In Situ  Postop Diagnosis: 1.Desires Surgical Sterilization 2.Stress Urinary Incontinence 3.Cystocele 4.Rectocele 5.IUD In Situ  Procedure: 1.LAPAROSCOPIC BILATERAL SALPINGECTOMY 2.REMOVAL OF IUD 3.TENSION FREE VAGINAL TAPE 4.CYSTOSCOPY  Anesthesia: General   Attending: Delice Lesch, MD   Assistant:  Earnstine Regal, PA-C  Findings: Normal appearing bilateral ovaries and tubes  Pathology: Bilateral fallopian tubes  Fluids: Crystalloid 2000cc Albumin 250 cc  UOP: 200 cc  EBL: 607 cc  Complications: None  Procedure: The patient was taken to the operating room after the risks, benefits, alternatives, complications, treatment options, and expected outcomes were discussed with the patient. The patient verbalized understanding, the patient concurred with the proposed plan and consent signed and witnessed. The patient was taken to the Operating Room, identified as Susan Zamora  and procedure verified as sterilization procedure via laparoscopic bilateral salpingectomy, TVT, Cystoscopy and Anterior and Posterior Colporrhaphy. A Time Out was held and the above information confirmed.  The patient was placed under general anesthesia per anesthesia staff, the patient was placed in modified dorsal lithotomy position and was prepped, draped, and catheterized in the normal, sterile fashion.  The cervix was visualized and an intrauterine manipulator was placed. A  10 mm umbilical incision was then performed. Veress needle was passed and pneumoperitoneum was established. A 10 mm trocar was advanced into the intraabdominal cavity, the laparoscope was introduced and findings as noted above.  A 74mm incision was made suprapubically and 61mm trocar advanced into the intraabdominal cavity under direct visualization.  The same was done in the LLQ.  The right fallopian tube was identified and carried  out to its fimbriated end and excised with the Ligasure.  The same was done on the contralateral side.  The laparoscope was removed and pneumoperitoneum released.  The fascia was repaired with an interrupted stitch of 0 vicryl and the skin was reapproximated with 3-0 monocryl via subcuticular stitch.  Dermabond was applied to close the 39mm incisions and used to reinforce the 37TG umbilical incision.  A weighted speculum was placed in the patient's vagina and the anterior vaginal wall was retracted using Deaver retractors. The anterior vaginal wall was injected with dilute pitressin and the anterior vaginal wall was then incised and dissected away from the underlying layer of tissue. The cystocele was repaired with plication stitches. The excess vaginal wall tissue was excised.  The incision in the anterior wall of the vagina was extended for approximately 1cm beneath the midurethra and the underlying tissue was dissected away from the anterior vaginal wall down to the level of the lower symphysis pubis bilaterally. Attention was then turned to the mons pubis where two 5 mm incisions were made 2 fingerbreadths from the midline. The transabdominal guide was then passed through the mons pubis incision on the patient's right down through the space of Retzius and out through the anterior vaginal wall after deflecting the rigid urethral catheter guide to the ipsilateral side. The same was done on the contralateral side. Cystoscopy was performed and no invadvertant bladder injury was noted. The bladder was drained with a Foley while deflecting the rigid urethral catheter guide to the patient's right and the mesh was attached to the transabdominal guide and elevated up through the space of Retzius and out through the incision on the mons pubis on the ipsilateral side. The same was done on the contralateral side. Cystoscopy was performed again and no inadvertant bladder injury was noted. The 89 French Foley was  left  in the urethra and a large Claiborne Billings was placed between the urethra and the mesh in order to leave the mesh slack beneath the midurethra. The mesh was then cut flush with the skin at the mons pubis incisions bilaterally.  Cystoscopy was performed again and bilateral ureters were noted to efflux without difficulty. The bilateral incisions on the mons pubis were then cleaned and dermabond applied. The anterior vaginal wall incision was repaired with 2-0 vicryl with a running interlocking stitch.  Attention was then turned to the posterior vaginal wall where dilute Pitressin was injected. The posterior vaginal wall was incised and the underlying tissue was dissected away from the posterior vaginal wall. The rectocele was repaired using plication stitches of 2-0 Vicryl. Excess posterior vaginal wall tissue was excised and the posterior vaginal wall repaired with 2-0 vicryl via a running interlocking stitch. The perineum was repaired with 2-0 Vicryl via a subcuticular stitch. Cystoscopy was performed and bilateral ureters were noted to efflux without difficulty and there were no inadvertent bladder injuries noted. The vagina was packed with estrogen-soaked packing. The patient tolerated the procedure well and was awaiting return to the recovery room in good condition.  Sponge, lap and needle count were correct.  Patient tolerated the procedure well and was returned to the recovery room in good condition.  I was present and scrubbed and the assistant was required due to complexity of anatomy.

## 2020-12-16 NOTE — Anesthesia Preprocedure Evaluation (Signed)
Anesthesia Evaluation  Patient identified by MRN, date of birth, ID band Patient awake    Reviewed: Allergy & Precautions, NPO status , Patient's Chart, lab work & pertinent test results  Airway Mallampati: I       Dental no notable dental hx.    Pulmonary former smoker,    Pulmonary exam normal        Cardiovascular hypertension, Pt. on medications Normal cardiovascular exam     Neuro/Psych PSYCHIATRIC DISORDERS Anxiety Depression    GI/Hepatic negative GI ROS, Neg liver ROS,   Endo/Other  negative endocrine ROS  Renal/GU negative Renal ROS  Female GU complaint     Musculoskeletal negative musculoskeletal ROS (+)   Abdominal Normal abdominal exam  (+)   Peds  Hematology negative hematology ROS (+)   Anesthesia Other Findings   Reproductive/Obstetrics negative OB ROS                             Anesthesia Physical Anesthesia Plan  ASA: II  Anesthesia Plan: General   Post-op Pain Management:    Induction: Intravenous  PONV Risk Score and Plan: 4 or greater and Ondansetron, Dexamethasone and Midazolam  Airway Management Planned: Oral ETT  Additional Equipment: None  Intra-op Plan:   Post-operative Plan: Extubation in OR  Informed Consent: I have reviewed the patients History and Physical, chart, labs and discussed the procedure including the risks, benefits and alternatives for the proposed anesthesia with the patient or authorized representative who has indicated his/her understanding and acceptance.     Dental advisory given  Plan Discussed with: CRNA  Anesthesia Plan Comments:         Anesthesia Quick Evaluation

## 2020-12-16 NOTE — Anesthesia Procedure Notes (Signed)
Procedure Name: Intubation Date/Time: 12/16/2020 2:02 PM Performed by: Rogers Blocker, CRNA Pre-anesthesia Checklist: Patient identified, Emergency Drugs available, Suction available and Patient being monitored Patient Re-evaluated:Patient Re-evaluated prior to induction Oxygen Delivery Method: Circle System Utilized Preoxygenation: Pre-oxygenation with 100% oxygen Induction Type: IV induction Ventilation: Mask ventilation without difficulty Laryngoscope Size: Mac Grade View: Grade I Tube type: Oral Number of attempts: 1 Airway Equipment and Method: Stylet Placement Confirmation: ETT inserted through vocal cords under direct vision,  positive ETCO2 and breath sounds checked- equal and bilateral Secured at: 22 cm Tube secured with: Tape Dental Injury: Teeth and Oropharynx as per pre-operative assessment

## 2020-12-17 ENCOUNTER — Encounter (HOSPITAL_BASED_OUTPATIENT_CLINIC_OR_DEPARTMENT_OTHER): Payer: Self-pay | Admitting: Obstetrics and Gynecology

## 2020-12-17 DIAGNOSIS — Z302 Encounter for sterilization: Secondary | ICD-10-CM | POA: Diagnosis not present

## 2020-12-17 LAB — BASIC METABOLIC PANEL
Anion gap: 4 — ABNORMAL LOW (ref 5–15)
BUN: 13 mg/dL (ref 6–20)
CO2: 28 mmol/L (ref 22–32)
Calcium: 8.2 mg/dL — ABNORMAL LOW (ref 8.9–10.3)
Chloride: 100 mmol/L (ref 98–111)
Creatinine, Ser: 0.9 mg/dL (ref 0.44–1.00)
GFR, Estimated: >60 mL/min (ref >60)
Glucose, Bld: 238 mg/dL — ABNORMAL HIGH (ref 70–99)
Potassium: 3.9 mmol/L (ref 3.5–5.1)
Sodium: 132 mmol/L — ABNORMAL LOW (ref 135–145)

## 2020-12-17 LAB — CBC
HCT: 28.8 % — ABNORMAL LOW (ref 36.0–46.0)
Hemoglobin: 9.6 g/dL — ABNORMAL LOW (ref 12.0–15.0)
MCH: 30.9 pg (ref 26.0–34.0)
MCHC: 33.3 g/dL (ref 30.0–36.0)
MCV: 92.6 fL (ref 80.0–100.0)
Platelets: 184 10*3/uL (ref 150–400)
RBC: 3.11 MIL/uL — ABNORMAL LOW (ref 3.87–5.11)
RDW: 12.5 % (ref 11.5–15.5)
WBC: 11 10*3/uL — ABNORMAL HIGH (ref 4.0–10.5)
nRBC: 0 % (ref 0.0–0.2)

## 2020-12-17 LAB — SURGICAL PATHOLOGY

## 2020-12-17 MED ORDER — NITROFURANTOIN MONOHYD MACRO 100 MG PO CAPS: 100.0000 mg | ORAL_CAPSULE | Freq: Two times a day (BID) | ORAL | 0 refills | Status: AC

## 2020-12-17 MED ORDER — ONDANSETRON HCL 4 MG/2ML IJ SOLN
INTRAMUSCULAR | Status: AC
  Filled 2020-12-17: qty 2

## 2020-12-17 MED ORDER — TRAMADOL HCL 50 MG PO TABS
100.0000 mg | ORAL_TABLET | Freq: Four times a day (QID) | ORAL | Status: DC
  Administered 2020-12-17: 100 mg via ORAL

## 2020-12-17 MED ORDER — TRAMADOL HCL 50 MG PO TABS
ORAL_TABLET | ORAL | Status: AC
  Filled 2020-12-17: qty 2

## 2020-12-17 MED ORDER — KETOROLAC TROMETHAMINE 30 MG/ML IJ SOLN
INTRAMUSCULAR | Status: AC
  Filled 2020-12-17: qty 1

## 2020-12-17 MED ORDER — TRAMADOL HCL 50 MG PO TABS: ORAL_TABLET | ORAL | 0 refills | Status: DC

## 2020-12-17 MED ORDER — OXYCODONE-ACETAMINOPHEN 5-325 MG PO TABS
ORAL_TABLET | ORAL | Status: AC
  Filled 2020-12-17: qty 1

## 2020-12-17 MED ORDER — IBUPROFEN 600 MG PO TABS: ORAL_TABLET | ORAL | 1 refills | Status: DC

## 2020-12-17 NOTE — Progress Notes (Signed)
Patient's vaginal packing and foley cath were removed around 02:00 am per orders without difficulty. There was minimal amount of blood on vaginal packing. Patient now due to void. Will continue to monitor patient.

## 2020-12-17 NOTE — Discharge Instructions (Signed)
Call [facility] @ [phone] if:  You have a temperature greater than or equal to 100.4 degrees Farenheit orally You have pain that is not made better by the pain medication given and taken as directed You have excessive bleeding or problems urinating  Take Colace (Docusate Sodium/Stool Softener) 100 mg 2-3 times daily while taking narcotic pain medicine to avoid constipation or until bowel movements are regular. Take, with food, Ibuprofen 600 mg every 6 hours for 5 days then as needed for pain  Limit caffeine consumption for the next 3 weeks  Begin in 2 weeks taking  an over the counter iron tablet every other day for the next 6 months  You may drive after 2  weeks You may walk up steps  You may shower  You may resume a regular diet  Keep incisions clean and dry Do not lift over 15 pounds for 6 weeks Avoid anything in vagina for 6 weeks    Anterior and Posterior Colporrhaphy and Sling Procedure, Care After The following information offers guidance on how to care for yourself after your procedure. Your health care provider may also give you more specific instructions. If you have problems or questions, contact your health care provider. What can I expect after the procedure? After the procedure, it is common to have:  Pain in the surgical area.  Vaginal spotting and discharge. You will need to use a sanitary pad during this time.  Tiredness (fatigue). Follow these instructions at home: Medicines  Take over-the-counter and prescription medicines only as told by your health care provider.  If you were prescribed an antibiotic medicine, take it as told by your health care provider. Do not stop using the antibiotic even if you start to feel better.  Ask your health care provider if the medicine prescribed to you: ? Requires you to avoid driving or using machinery. ? Can cause constipation. You may need to take these actions to prevent or treat  constipation:  Drink enough fluid to keep your urine pale yellow.  Take over-the-counter or prescription medicines.  Eat foods that are high in fiber, such as beans, whole grains, and fresh fruits and vegetables.  Limit foods that are high in fat and processed sugars, such as fried or sweet foods. Incision care  Check your incision area every day for signs of infection. Check for: ? More fluid or blood coming from your vagina. ? Pus or a bad-smelling discharge from your vagina. ? More pain or swelling in your vaginal area.  Do not take baths, swim, or use a hot tub until your health care provider approves. You may take showers.  Keep the area between your vagina and rectum (perineal area) clean and dry. Make sure you clean the area after every bowel movement and each time you urinate.  Ask your health care provider if you can take a sitz bath or sit in a tub of clean, warm water. Activity  Rest as told by your health care provider.  Avoid sitting for a long time without moving. Get up to take short walks every 1-2 hours. This is important to improve blood flow and breathing. Ask for help if you feel weak or unsteady.  Avoid activities that take a lot of effort (are strenuous).  Do not lift anything that is heavier than 5 lb (2.3 kg), or the limit that you are told, until your health care provider says that it is safe.  Avoid pushing or pulling motions.  Return  to your normal activities as told by your health care provider. Ask your health care provider what activities are safe for you.   General instructions  You may be instructed to do pelvic floor exercises (Kegel exercises). Do them as told by your health care provider.  Wear compression stockings as told by your health care provider. These stockings help to prevent blood clots and reduce swelling in your legs.  If you have a urinary catheter in place, follow instructions from your health care provider about how to empty  the catheter bag.  Do not douche, use tampons, or have sex until your health care provider says it is okay.  Keep all follow-up visits. This is important. Contact a health care provider if:  Medicine does not help your pain.  You have new bruising in the vaginal area.  You have frequent or urgent urination, or you are unable to completely empty your bladder.  You feel a burning sensation when urinating.  You have more fluid or blood coming from your vaginal area.  You have pus or a bad-smelling discharge coming from the vaginal area.  You have more pain or swelling in your vaginal area. Get help right away if:  You have a fever or chills.  You have heavy vaginal bleeding.  You cannot urinate or your catheter stops draining.  You have chest, abdominal, or leg pain.  You have trouble breathing. These symptoms may represent a serious problem that is an emergency. Do not wait to see if the symptoms will go away. Get medical help right away. Call your local emergency services (911 in the [place] Do not drive yourself to the hospital. Summary  After the procedure, it is common to have pain, fatigue, and vaginal spotting and discharge.  Keep the area between your vagina and rectum (perineal area) clean and dry. Make sure you clean the area after each bowel movement and each time you urinate.  Follow instructions from your health care provider on any activity restrictions after the procedure. This information is not intended to replace advice given to you by your health care provider. Make sure you discuss any questions you have with your health care provider. Document Revised: 01/14/2020 Document Reviewed: 01/14/2020 Elsevier Patient Education  2021 [place], Adult An indwelling urinary catheter is a thin tube that is put into your bladder. The tube helps to drain pee (urine) out of your body. The tube goes in through your urethra. Your  urethra is where pee comes out of your body. Your pee will come out through the catheter, then it will go into a bag (drainage bag). Take good care of your catheter so it will work well. How to wear your catheter and bag Supplies needed  Sticky tape (adhesive tape) or a leg strap.  Alcohol wipe or soap and water (if you use tape).  A clean towel (if you use tape).  Large overnight bag.  Smaller bag (leg bag). Wearing your catheter Attach your catheter to your leg with tape or a leg strap.  Make sure the catheter is not pulled tight.  If a leg strap gets wet, take it off and put on a dry strap.  If you use tape to hold the bag on your leg: 1. Use an alcohol wipe or soap and water to wash your skin where the tape made it sticky before. 2. Use a clean towel to pat-dry that skin. 3. Use new tape to  make the bag stay on your leg. Wearing your bags You should have been given a large overnight bag.  You may wear the overnight bag in the day or night.  Always have the overnight bag lower than your bladder.  Do not let the bag touch the floor.  Before you go to sleep, put a clean plastic bag in a wastebasket. Then hang the overnight bag inside the wastebasket. You should also have a smaller leg bag that fits under your clothes.  Always wear the leg bag below your knee.  Do not wear your leg bag at night. How to care for your skin and catheter Supplies needed  A clean washcloth.  Water and mild soap.  A clean towel. Caring for your skin and catheter  Clean the skin around your catheter every day: 1. Wash your hands with soap and water. 2. Wet a clean washcloth in warm water and mild soap. 3. Clean the skin around your urethra.  If you are female:  Gently spread the folds of skin around your vagina (labia).  With the washcloth in your other hand, wipe the inner side of your labia on each side. Wipe from front to back.  If you are female:  Pull back any skin that covers  the end of your penis (foreskin).  With the washcloth in your other hand, wipe your penis in small circles. Start wiping at the tip of your penis, then move away from the catheter.  Move the foreskin back in place, if needed. 4. With your free hand, hold the catheter close to where it goes into your body.  Keep holding the catheter during cleaning so it does not get pulled out. 5. With the washcloth in your other hand, clean the catheter.  Only wipe downward on the catheter.  Do not wipe upward toward your body. Doing this may push germs into your urethra and cause infection. 6. Use a clean towel to pat-dry the catheter and the skin around it. Make sure to wipe off all soap. 7. Wash your hands with soap and water.  Shower every day. Do not take baths.  Do not use cream, ointment, or lotion on the area where the catheter goes into your body, unless your doctor tells you to.  Do not use powders, sprays, or lotions on your genital area.  Check your skin around the catheter every day for signs of infection. Check for: ? Redness, swelling, or pain. ? Fluid or blood. ? Warmth. ? Pus or a bad smell.      How to empty the bag Supplies needed  Rubbing alcohol.  Gauze pad or cotton ball.  Tape or a leg strap. Emptying the bag Pour the pee out of your bag when it is ?- full, or at least 2-3 times a day. Do this for your overnight bag and your leg bag. 1. Wash your hands with soap and water. 2. Separate (detach) the bag from your leg. 3. Hold the bag over the toilet or a clean pail. Keep the bag lower than your hips and bladder. This is so the pee (urine) does not go back into the tube. 4. Open the pour spout. It is at the bottom of the bag. 5. Empty the pee into the toilet or pail. Do not let the pour spout touch any surface. 6. Put rubbing alcohol on a gauze pad or cotton ball. 7. Use the gauze pad or cotton ball to clean the pour spout. 8. Close the  pour spout. 9. Attach the  bag to your leg with tape or a leg strap. 10. Wash your hands with soap and water. Follow instructions for cleaning the drainage bag:  From the product maker.  As told by your doctor. How to change the bag Supplies needed  Alcohol wipes.  A clean bag.  Tape or a leg strap. Changing the bag Replace your bag when it starts to leak, smell bad, or look dirty. 1. Wash your hands with soap and water. 2. Separate the dirty bag from your leg. 3. Pinch the catheter with your fingers so that pee does not spill out. 4. Separate the catheter tube from the bag tube where these tubes connect (at the connection valve). Do not let the tubes touch any surface. 5. Clean the end of the catheter tube with an alcohol wipe. Use a different alcohol wipe to clean the end of the bag tube. 6. Connect the catheter tube to the tube of the clean bag. 7. Attach the clean bag to your leg with tape or a leg strap. Do not make the bag tight on your leg. 8. Wash your hands with soap and water. General rules  Never pull on your catheter. Never try to take it out. Doing that can hurt you.  Always wash your hands before and after you touch your catheter or bag. Use a mild, fragrance-free soap. If you do not have soap and water, use hand sanitizer.  Always make sure there are no twists or bends (kinks) in the catheter tube.  Always make sure there are no leaks in the catheter or bag.  Drink enough fluid to keep your pee pale yellow.  Do not take baths, swim, or use a hot tub.  If you are female, wipe from front to back after you poop (have a bowel movement).   Contact a doctor if:  Your pee is cloudy.  Your pee smells worse than usual.  Your catheter gets clogged.  Your catheter leaks.  Your bladder feels full. Get help right away if:  You have redness, swelling, or pain where the catheter goes into your body.  You have fluid, blood, pus, or a bad smell coming from the area where the catheter goes  into your body.  Your skin feels warm where the catheter goes into your body.  You have a fever.  You have pain in your: ? Belly (abdomen). ? Legs. ? Lower back. ? Bladder.  You see blood in the catheter.  Your pee is pink or red.  You feel sick to your stomach (nauseous).  You throw up (vomit).  You have chills.  Your pee is not draining into the bag.  Your catheter gets pulled out. Summary  An indwelling urinary catheter is a thin tube that is placed into the bladder to help drain pee (urine) out of the body.  The catheter is placed into the part of the body that drains pee from the bladder (urethra).  Taking good care of your catheter will keep it working properly and help prevent problems.  Always wash your hands before and after touching your catheter or bag.  Never pull on your catheter or try to take it out. This information is not intended to replace advice given to you by your health care provider. Make sure you discuss any questions you have with your health care provider. Document Revised: 11/09/2018 Document Reviewed: 03/03/2017 Elsevier Patient Education  [place].

## 2020-12-17 NOTE — Discharge Summary (Signed)
Physician Discharge Summary  Patient ID: Susan Zamora MRN: [id number] DOB/AGE: 12/24/1980 40 y.o.  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  Post operative course was marked by urinary retention for which the patient was prescribed a Foley leg bag for home use with a follow up visit with Dr. Mancel Bale in 1 week.  Her bowel function had resumed by post operative day #1 and the patient was therefore deemed ready for discharge home.  Discharge hemoglobin was 9.6.  Disposition: Discharge disposition: 01-Home or Self Care       Discharge Medications:  Allergies as of 12/17/2020      Reactions   Amoxicillin Nausea And Vomiting   Has patient had a PCN reaction causing immediate rash, facial/tongue/throat swelling, SOB or lightheadedness with hypotension: No Has patient had a PCN reaction causing severe rash involving mucus membranes or skin necrosis: No Has patient had a PCN reaction that required hospitalization No Has patient had a PCN reaction occurring within the last 10 years: Yes If all of the above answers are "NO", then may proceed with Cephalosporin use.   Codeine Nausea Only      Medication List    TAKE these medications   buPROPion 150 MG 24 hr tablet Commonly known as: WELLBUTRIN XL TAKE 1 TABLET(150 MG) BY MOUTH DAILY   ibuprofen 600 MG tablet Commonly known as: ADVIL take 1 tablet po pc every 6 hours for 5 days then as needed for post operative pain   nitrofurantoin (macrocrystal-monohydrate) 100 MG capsule Commonly known as:  Macrobid Take 1 capsule (100 mg total) by mouth 2 (two) times daily for 7 days.   olmesartan 40 MG tablet Commonly known as: BENICAR Take 40 mg by mouth every evening.   sertraline 50 MG tablet Commonly known as: ZOLOFT Take 1.5 tablets (75 mg total) by mouth daily.   traMADol 50 MG tablet Commonly known as: Ultram take 1 tablet po every 4-6 hours as needed for breakthrough post operative pain   Vitamin D 125 MCG (5000 UT) Caps Take by mouth.          Follow-up: Dr. Harvie Bridge. Mancel Bale on Dec 23, 2020 at 4:30 p.m.     Signed: Earnstine Regal, PA-C 12/17/2020, 8:42 AM

## 2020-12-17 NOTE — Progress Notes (Signed)
Susan Zamora is a70 y.o.  [id number]  Post Op Date # 1: Laparoscopic BS/A-P Coloporrhaphy/Placement of TVT/Cystoscopy/Removal of IUD  Subjective: Patient is Doing well postoperatively. Patient has Pain is controlled with current analgesics. Medications being used: prescription NSAID's including Ketorolac 30 mg IV. Patient had some vomiting last evening attributed to her narcotic analgesia therefore it was changed from Percocet to Tramadol this morning. She reports transient lightheadednes when she first ambulated but none since.  Has not been able to void so a Foley leg bag has been placed and she was given instructions on its use.  Objective: Vital signs in last 24 hours: Temp:  [97.8 F (36.6 C)-98.9 F (37.2 C)] 98.4 F (36.9 C) (05/19 2111) Pulse Rate:  [73-94] 75 (05/19 0638) Resp:  [12-24] 22 (05/19 0638) BP: (95-121)/(50-82) 104/70 (05/19 0638) SpO2:  [96 %-100 %] 96 % (05/19 5520) Weight:  [65.6 kg] 65.6 kg (05/18 1116)  Intake/Output from previous day: 05/18 0701 - 05/19 0700 In: 2723.7 [P.O.:200; I.V.:2066.7] Out: 1850 [Urine:1150] Intake/Output this shift: No intake/output data recorded. Recent Labs  Lab 12/14/20 1000 12/17/20 0216  WBC 4.5 11.0*  HGB 12.5 9.6*  HCT 38.1 28.8*  PLT 221 184     Recent Labs  Lab 12/14/20 1000 12/17/20 0216  NA 139 132*  K 4.2 3.9  CL 109 100  CO2 27 28  BUN 15 13  CREATININE 0.89 0.90  CALCIUM 9.2 8.2*  GLUCOSE 98 238*    EXAM: General: cooperative, fatigued and no distress Resp: clear to auscultation bilaterally Cardio: regular rate and rhythm, S1, S2 normal, no murmur, click, rub or gallop GI: bowel sounds are present and incisons intact with dried blood (to include mons incsions) Extremities: Homans sign is negative, no sign of DVT and SCD hose in place and functioning with no calf tenderness. Vaginal Bleeding: moderate   Assessment: s/p Procedure(s): LAPAROSCOPIC BILATERAL SALPINGECTOMY TRANSVAGINAL  TAPE (TVT) PROCEDURE CYSTOSCOPY INTRAUTERINE DEVICE (IUD) REMOVAL ANTERIOR (CYSTOCELE) AND POSTERIOR REPAIR (RECTOCELE): stable, tolerating diet, urinary retention and anemia  Plan: Discharge home Foley leg bag training and Foley leg bag given to use at home.  patient will follow up with Dr. Mancel Bale in 1 week.  LOS: 0 days    Earnstine Regal, PA-C 12/17/2020 8:22 AM

## 2021-07-07 ENCOUNTER — Encounter: Payer: Self-pay | Admitting: Psychiatry

## 2021-07-07 ENCOUNTER — Other Ambulatory Visit: Payer: Self-pay

## 2021-07-07 ENCOUNTER — Ambulatory Visit: Payer: PRIVATE HEALTH INSURANCE | Admitting: Psychiatry

## 2021-07-07 DIAGNOSIS — F3342 Major depressive disorder, recurrent, in full remission: Secondary | ICD-10-CM | POA: Diagnosis not present

## 2021-07-07 DIAGNOSIS — F3281 Premenstrual dysphoric disorder: Secondary | ICD-10-CM

## 2021-07-07 DIAGNOSIS — F419 Anxiety disorder, unspecified: Secondary | ICD-10-CM | POA: Diagnosis not present

## 2021-07-07 MED ORDER — BUPROPION HCL ER (XL) 150 MG PO TB24: ORAL_TABLET | ORAL | 11 refills | Status: DC

## 2021-07-07 MED ORDER — SERTRALINE HCL 50 MG PO TABS: 75.0000 mg | ORAL_TABLET | Freq: Every day | ORAL | 11 refills | Status: DC

## 2021-07-07 NOTE — Progress Notes (Signed)
Susan Zamora [id number] [birth year] 40 y.o.  Subjective:   Patient ID:  Susan Zamora is a 40 y.o. (DOB [birth year]) female.  Chief Complaint:  Chief Complaint  Patient presents with   Follow-up    H/o Depression and anxiety     HPI [name] presents to the office today for follow-up of anxiety and depression. Mood has been stable. She notices mood is slightly lower a few days before her period. Denies anxiety. Sleep is ok. Appetite is ok. Energy and motivation have been good. Concentration has been adequate. Denies SI.   Reports ETOH is very minimal.   Had 40 lbs intentional weight loss with Optivia.  Had surgery with a 9 week recovery.  Oldest just turned 9. Daughter will be turning 24 yo and youngest 87.5 yo.   Father-in-law has Pancreatic Cancer.   Flowsheet Row Admission (Discharged) from 12/16/2020 in [place] No Risk        Review of Systems:  Review of Systems  Musculoskeletal:  Negative for gait problem.  Neurological:  Negative for tremors.  Psychiatric/Behavioral:         Please refer to HPI   Medications: I have reviewed the patient's current medications.  Current Outpatient Medications  Medication Sig Dispense Refill   buPROPion (WELLBUTRIN XL) 150 MG 24 hr tablet TAKE 1 TABLET(150 MG) BY MOUTH DAILY 30 tablet 11   sertraline (ZOLOFT) 50 MG tablet Take 1.5 tablets (75 mg total) by mouth daily. 45 tablet 11   No current facility-administered medications for this visit.    Medication Side Effects: None  Allergies:  Allergies  Allergen Reactions   Percocet [Oxycodone-Acetaminophen] Nausea And Vomiting   Amoxicillin Nausea And Vomiting    Has patient had a PCN reaction causing immediate rash, facial/tongue/throat swelling, SOB or lightheadedness with hypotension: No Has patient had a PCN reaction causing severe rash involving mucus membranes or skin necrosis: No Has patient had a PCN reaction that required  hospitalization No Has patient had a PCN reaction occurring within the last 10 years: Yes If all of the above answers are "NO", then may proceed with Cephalosporin use.    Codeine Nausea Only    Past Medical History:  Diagnosis Date   Abnormal Pap smear 06/2011   ASCUS   Anxiety    COVID 08/01/2020   cough and fever x 2 days all symptoms resolved   Depression    Gestational hypertension 09/28/2016   H/O varicella as child   Hyperemesis arising during pregnancy 2013   Hypertension    Vaginal delivery 09/28/2016   Vitamin D deficiency     Past Medical History, Surgical history, Social history, and Family history were reviewed and updated as appropriate.   Please see review of systems for further details on the patient's review from today.   Objective:   Physical Exam:  BP (!) 138/98   Pulse 72   Wt 138 lb (62.6 kg)   BMI 25.24 kg/m   Physical Exam Constitutional:      General: She is not in acute distress. Musculoskeletal:        General: No deformity.  Neurological:     Mental Status: She is alert and oriented to person, place, and time.     Coordination: Coordination normal.  Psychiatric:        Attention and Perception: Attention and perception normal. She does not perceive auditory or visual hallucinations.        Mood  and Affect: Mood normal. Mood is not anxious or depressed. Affect is not labile, blunt, angry or inappropriate.        Speech: Speech normal.        Behavior: Behavior normal.        Thought Content: Thought content normal. Thought content is not paranoid or delusional. Thought content does not include homicidal or suicidal ideation. Thought content does not include homicidal or suicidal plan.        Cognition and Memory: Cognition and memory normal.        Judgment: Judgment normal.     Comments: Insight intact    Lab Review:     Component Value Date/Time   NA 132 (L) 12/17/2020 0216   K 3.9 12/17/2020 0216   CL 100 12/17/2020 0216   CO2 28  12/17/2020 0216   GLUCOSE 238 (H) 12/17/2020 0216   GLUCOSE 90 02/10/2012 0800   BUN 13 12/17/2020 0216   CREATININE 0.90 12/17/2020 0216   CALCIUM 8.2 (L) 12/17/2020 0216   PROT 5.0 (L) 09/30/2016 0539   ALBUMIN 2.1 (L) 09/30/2016 0539   AST 43 (H) 09/30/2016 0539   ALT 65 (H) 09/30/2016 0539   ALKPHOS 132 (H) 09/30/2016 0539   BILITOT 0.4 09/30/2016 0539   GFRNONAA >60 12/17/2020 0216   GFRAA >60 09/30/2016 0539       Component Value Date/Time   WBC 11.0 (H) 12/17/2020 0216   RBC 3.11 (L) 12/17/2020 0216   HGB 9.6 (L) 12/17/2020 0216   HCT 28.8 (L) 12/17/2020 0216   PLT 184 12/17/2020 0216   MCV 92.6 12/17/2020 0216   MCH 30.9 12/17/2020 0216   MCHC 33.3 12/17/2020 0216   RDW 12.5 12/17/2020 0216    No results found for: POCLITH, LITHIUM   No results found for: PHENYTOIN, PHENOBARB, VALPROATE, CBMZ   .res Assessment: Plan:   Will continue current plan of care since target signs and symptoms are well controlled without any tolerability issues. Continue Wellbutrin XL 150 mg po qd for depression. Continue Sertraline 75 mg po qd for anxiety and depression.  Pt to follow-up in one year or sooner if clinically indicated.  Patient advised to contact office with any questions, adverse effects, or acute worsening in signs and symptoms.   [name] was seen today for follow-up.  Diagnoses and all orders for this visit:  Recurrent major depressive disorder, in full remission ([place]) -     buPROPion (WELLBUTRIN XL) 150 MG 24 hr tablet; TAKE 1 TABLET(150 MG) BY MOUTH DAILY  Premenstrual dysphoric disorder -     sertraline (ZOLOFT) 50 MG tablet; Take 1.5 tablets (75 mg total) by mouth daily.  Anxiety disorder, unspecified type -     sertraline (ZOLOFT) 50 MG tablet; Take 1.5 tablets (75 mg total) by mouth daily.    Please see After Visit Summary for patient specific instructions.  Future Appointments  Date Time Provider [place]  07/07/2022 10:00 AM Thayer Headings, PMHNP CP-[facility] None     No orders of the defined types were placed in this encounter.   -------------------------------

## 2021-10-17 IMAGING — US US THYROID
1 series · 13 of 25 positions shown · non-contrast
Comparison: None.

CLINICAL DATA: 39-year-old female with thyroid nodules

EXAM:
THYROID ULTRASOUND
TECHNIQUE: Ultrasound examination of the thyroid gland and adjacent soft
tissues was performed.

[Series 1: us thyroid · 0.07mm/px · 13 of 48 slices shown]
[im 1/48]
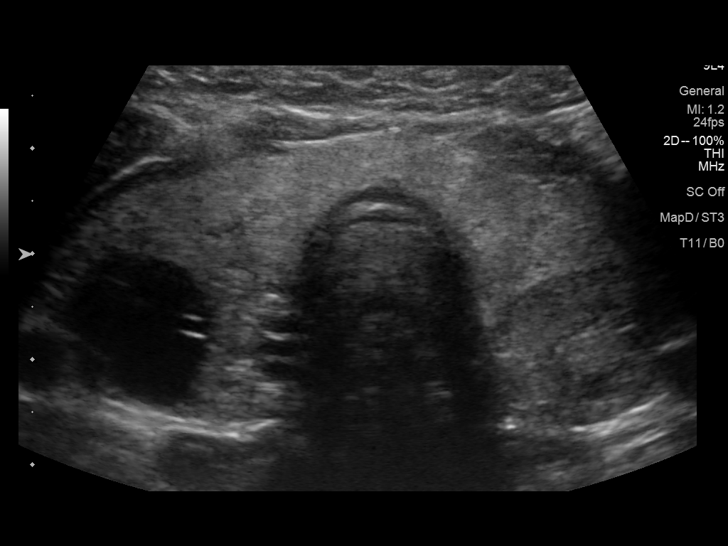
[im 4/48]
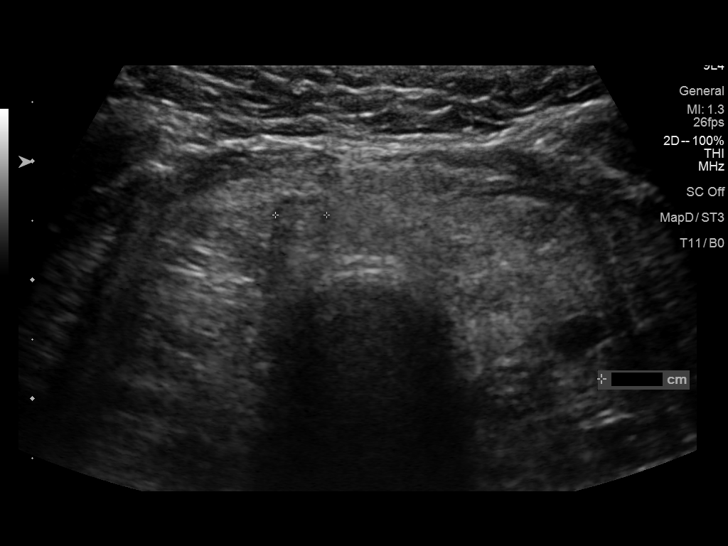
[im 8/48]
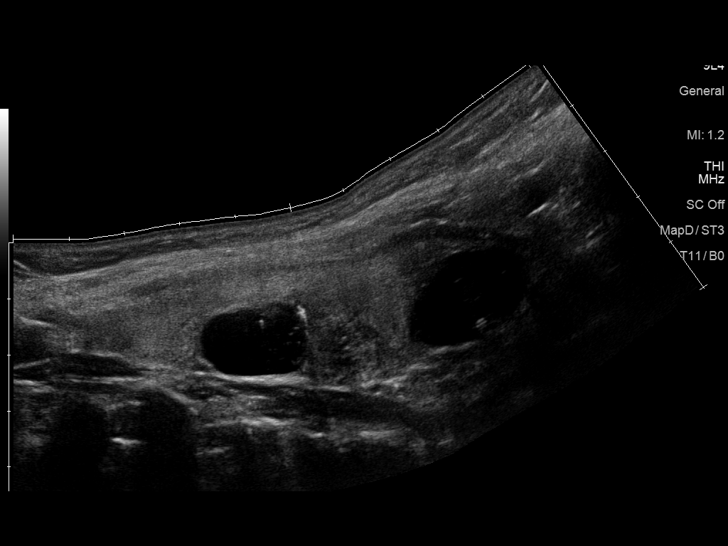
[im 12/48]
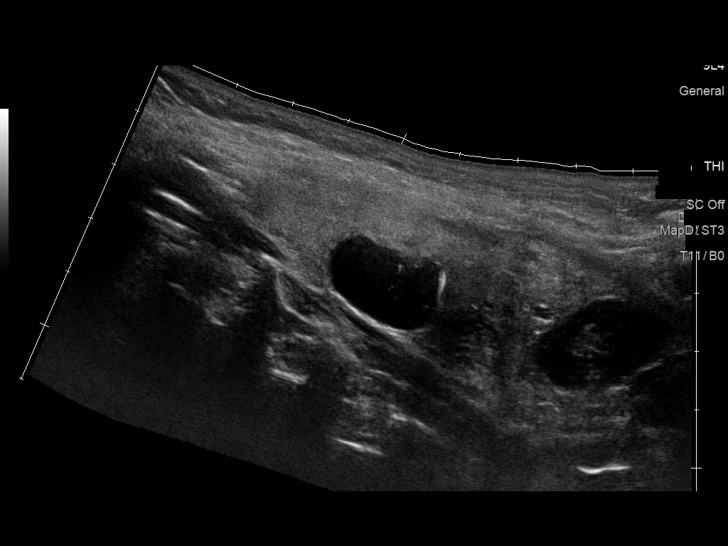
[im 16/48]
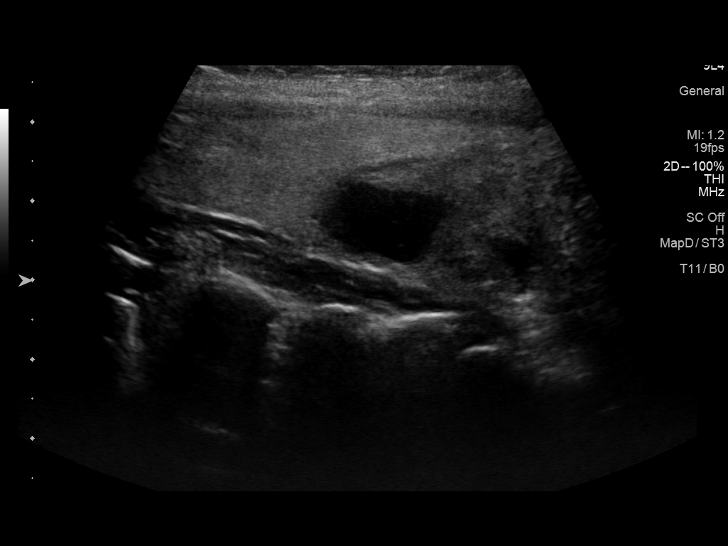
[im 20/48]
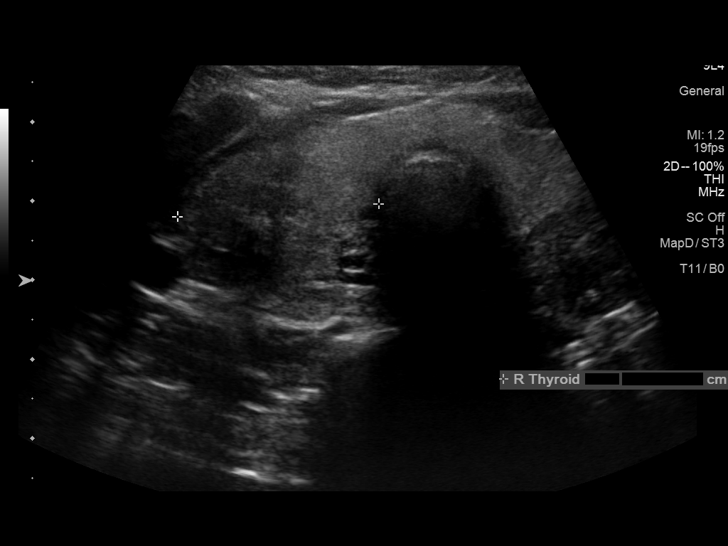
[im 24/48]
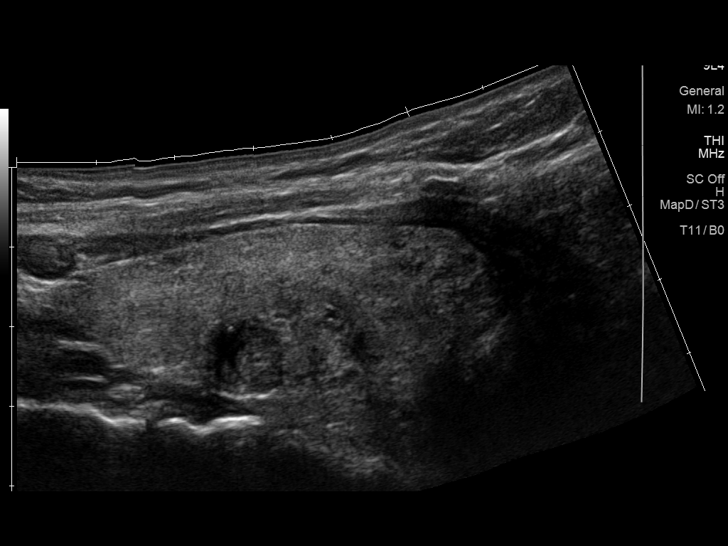
[im 28/48]
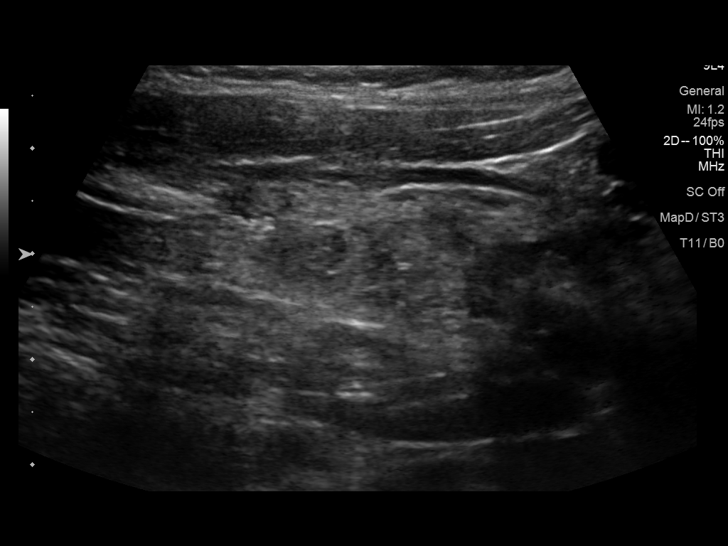
[im 32/48]
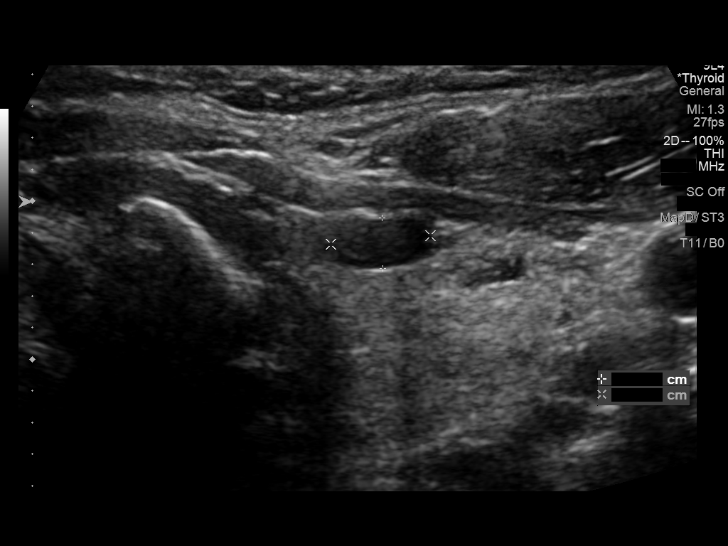
[im 36/48]
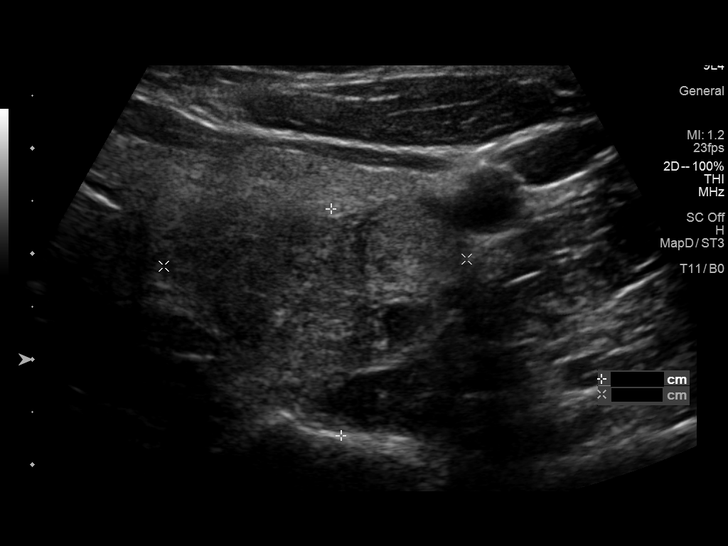
[im 40/48]
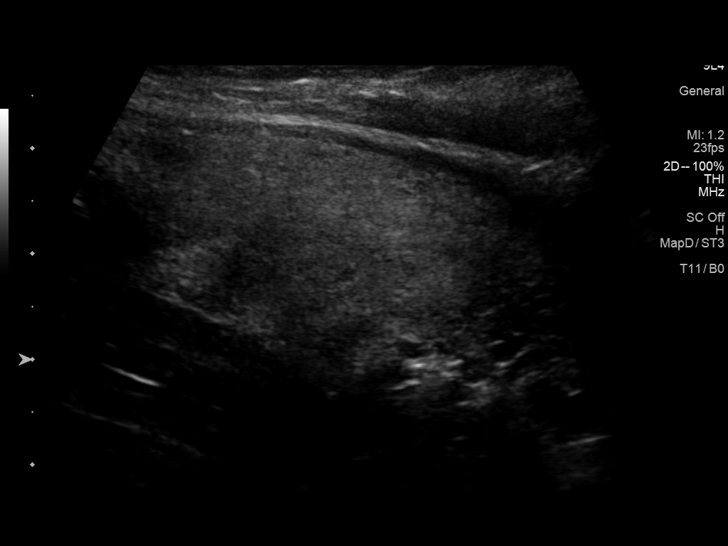
[im 44/48]
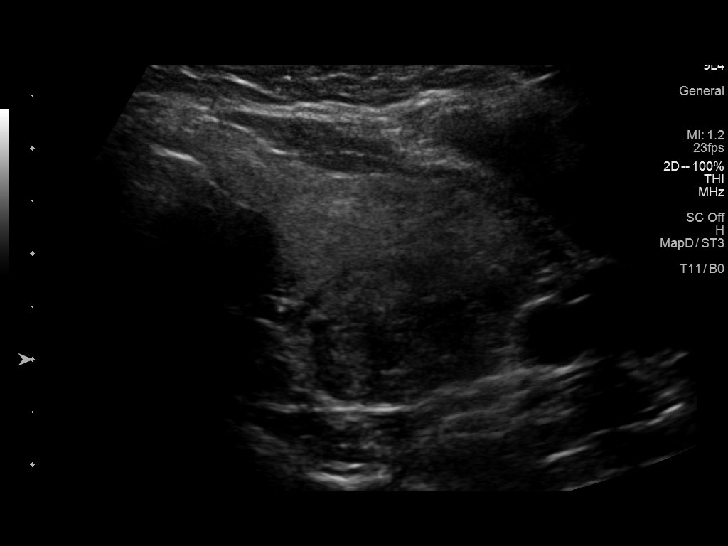
[im 48/48]
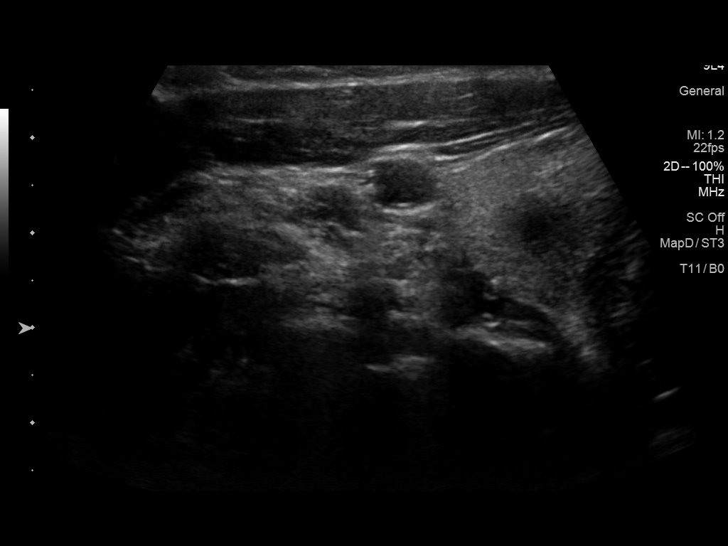

[13 of 25 positions shown; findings below may reference images not displayed]

FINDINGS: Parenchymal Echotexture: Moderately heterogenous

Isthmus: 0.9 cm

Right lobe: 8.8 cm x 2.4 cm x 2.6 cm

Left lobe: 5.8 cm x 2.9 cm x 2.4 cm

_________________________________________________________

Estimated total number of nodules >/= 1 cm: 2

Number of spongiform nodules >/=  2 cm not described below (TR1): 0

Number of mixed cystic and solid nodules >/= 1.5 cm not described
below (TR2): 0

_________________________________________________________

Nodule # 1:

Location: Right; Mid

Maximum size: 6.2 cm; Other 2 dimensions: 5.4 cm x 2.3 cm

Composition: mixed cystic and solid (1)

Echogenicity: isoechoic (1)

Shape: not taller-than-wide (0)

Margins: ill-defined (0)

Echogenic foci: none (0)

ACR TI-RADS total points: 2.

ACR TI-RADS risk category: TR2 (2 points).

ACR TI-RADS recommendations:

Nodule does not meet criteria for surveillance or biopsy

_________________________________________________________

Nodule # 2:

Location: Left; Superior

Maximum size: 0.8 cm; Other 2 dimensions: 0.6 cm x 0.3 cm

Composition: cannot determine (2)

Echogenicity: hypoechoic (2)

Shape: not taller-than-wide (0)

Margins: smooth (0)

Echogenic foci: none (0)

ACR TI-RADS total points: 4.

ACR TI-RADS risk category: TR4 (4-6 points).

ACR TI-RADS recommendations:

Nodule does not meet criteria for surveillance or biopsy

_________________________________________________________

Nodule # 3:

Location: Left; Mid

Maximum size: 0.8 cm; Other 2 dimensions: 0.7 cm x 0.3 cm

Composition: cannot determine (2)

Echogenicity: hypoechoic (2)

Shape: not taller-than-wide (0)

Margins: smooth (0)

Echogenic foci: none (0)

ACR TI-RADS total points: 4.

ACR TI-RADS risk category: TR4 (4-6 points).

ACR TI-RADS recommendations:

Nodule does not meet criteria for surveillance or biopsy

_________________________________________________________

Nodule # 4:

Location: Left; Inferior

Maximum size: 5.0 cm; Other 2 dimensions: 2.9 cm x 2.2 cm

Composition: mixed cystic and solid (1)

Echogenicity: isoechoic (1)

Shape: not taller-than-wide (0)

Margins: ill-defined (0)

Echogenic foci: punctate echogenic foci (3)

ACR TI-RADS total points: 5.

ACR TI-RADS risk category: TR4 (4-6 points).

ACR TI-RADS recommendations:

Nodule meets criteria for biopsy

_________________________________________________________

No adenopathy
IMPRESSION: Heterogeneous multinodular goiter.

Left inferior thyroid nodule (labeled 4, 5.0 cm, TR 4) meets
criteria for biopsy, as designated by the newly established ACR
TI-RADS criteria, and referral for biopsy is recommended.

Recommendations follow those established by the new ACR TI-RADS
criteria ([HOSPITAL] 9978;[DATE]).

## 2021-10-26 ENCOUNTER — Other Ambulatory Visit: Payer: Self-pay

## 2021-10-26 DIAGNOSIS — F3342 Major depressive disorder, recurrent, in full remission: Secondary | ICD-10-CM

## 2021-10-26 DIAGNOSIS — F419 Anxiety disorder, unspecified: Secondary | ICD-10-CM

## 2021-10-26 DIAGNOSIS — F3281 Premenstrual dysphoric disorder: Secondary | ICD-10-CM

## 2021-10-26 MED ORDER — SERTRALINE HCL 50 MG PO TABS: 75.0000 mg | ORAL_TABLET | Freq: Every day | ORAL | 11 refills | Status: DC

## 2021-10-26 MED ORDER — BUPROPION HCL ER (XL) 150 MG PO TB24: ORAL_TABLET | ORAL | 11 refills | Status: DC

## 2022-07-07 ENCOUNTER — Encounter: Payer: Self-pay | Admitting: Psychiatry

## 2022-07-07 ENCOUNTER — Ambulatory Visit: Payer: PRIVATE HEALTH INSURANCE | Admitting: Psychiatry

## 2022-07-07 DIAGNOSIS — F419 Anxiety disorder, unspecified: Secondary | ICD-10-CM

## 2022-07-07 DIAGNOSIS — F3281 Premenstrual dysphoric disorder: Secondary | ICD-10-CM

## 2022-07-07 DIAGNOSIS — F3342 Major depressive disorder, recurrent, in full remission: Secondary | ICD-10-CM

## 2022-07-07 MED ORDER — BUPROPION HCL ER (XL) 150 MG PO TB24: ORAL_TABLET | ORAL | 11 refills | Status: DC

## 2022-07-07 MED ORDER — SERTRALINE HCL 50 MG PO TABS: 75.0000 mg | ORAL_TABLET | Freq: Every day | ORAL | 11 refills | Status: DC

## 2022-07-07 NOTE — Progress Notes (Signed)
Susan Zamora [id number] 1981-06-14 41 y.o.  Subjective:   Patient ID:  Susan Zamora is a 41 y.o. (DOB 15-Jan-1981) female.  Chief Complaint:  Chief Complaint  Patient presents with   Follow-up    Anxiety and depression    HPI Susan Zamora presents to the office today for follow-up of anxiety and depression. She reports that she has been doing well overall. Denies depressed mood or anxiety. Energy and motivation have been ok. Would like to have more motivation to be physically active. Sleep is interrupted by youngest child. Denies difficulty falling or staying asleep. Denies concentration impairment. Appetite is stable. Denies SI.   Youngest child started school this year. Oldest child (son) is 27 yo. Daughter is in the middle.   Enjoys reading. She has been volunteering at their school.   She social ETOH use.    Flowsheet Row Admission (Discharged) from 12/16/2020 in [facility] No Risk        Review of Systems:  Review of Systems  Gastrointestinal: Negative.   Musculoskeletal:  Negative for gait problem.  Neurological:  Negative for tremors and headaches.  Psychiatric/Behavioral:         Please refer to HPI    Has upcoming physical exam in January.   Medications: I have reviewed the patient's current medications.  Current Outpatient Medications  Medication Sig Dispense Refill   buPROPion (WELLBUTRIN XL) 150 MG 24 hr tablet TAKE 1 TABLET(150 MG) BY MOUTH DAILY 30 tablet 11   sertraline (ZOLOFT) 50 MG tablet Take 1.5 tablets (75 mg total) by mouth daily. 45 tablet 11   No current facility-administered medications for this visit.    Medication Side Effects: None  Allergies:  Allergies  Allergen Reactions   Percocet [Oxycodone-Acetaminophen] Nausea And Vomiting   Amoxicillin Nausea And Vomiting    Has patient had a PCN reaction causing immediate rash, facial/tongue/throat swelling, SOB or lightheadedness with hypotension:  No Has patient had a PCN reaction causing severe rash involving mucus membranes or skin necrosis: No Has patient had a PCN reaction that required hospitalization No Has patient had a PCN reaction occurring within the last 10 years: Yes If all of the above answers are "NO", then may proceed with Cephalosporin use.    Codeine Nausea Only    Past Medical History:  Diagnosis Date   Abnormal Pap smear 06/2011   ASCUS   Anxiety    COVID 08/01/2020   cough and fever x 2 days all symptoms resolved   Depression    Gestational hypertension 09/28/2016   H/O varicella as child   Hyperemesis arising during pregnancy 2013   Hypertension    Vaginal delivery 09/28/2016   Vitamin D deficiency     Past Medical History, Surgical history, Social history, and Family history were reviewed and updated as appropriate.   Please see review of systems for further details on the patient's review from today.   Objective:   Physical Exam:  There were no vitals taken for this visit.  Physical Exam Constitutional:      General: She is not in acute distress. Musculoskeletal:        General: No deformity.  Neurological:     Mental Status: She is alert and oriented to person, place, and time.     Coordination: Coordination normal.  Psychiatric:        Attention and Perception: Attention and perception normal. She does not perceive auditory or visual hallucinations.  Mood and Affect: Mood normal. Mood is not anxious or depressed. Affect is not labile, blunt, angry or inappropriate.        Speech: Speech normal.        Behavior: Behavior normal.        Thought Content: Thought content normal. Thought content is not paranoid or delusional. Thought content does not include homicidal or suicidal ideation. Thought content does not include homicidal or suicidal plan.        Cognition and Memory: Cognition and memory normal.        Judgment: Judgment normal.     Comments: Insight intact     Lab  Review:     Component Value Date/Time   NA 132 (L) 12/17/2020 0216   K 3.9 12/17/2020 0216   CL 100 12/17/2020 0216   CO2 28 12/17/2020 0216   GLUCOSE 238 (H) 12/17/2020 0216   GLUCOSE 90 02/10/2012 0800   BUN 13 12/17/2020 0216   CREATININE 0.90 12/17/2020 0216   CALCIUM 8.2 (L) 12/17/2020 0216   PROT 5.0 (L) 09/30/2016 0539   ALBUMIN 2.1 (L) 09/30/2016 0539   AST 43 (H) 09/30/2016 0539   ALT 65 (H) 09/30/2016 0539   ALKPHOS 132 (H) 09/30/2016 0539   BILITOT 0.4 09/30/2016 0539   GFRNONAA >60 12/17/2020 0216   GFRAA >60 09/30/2016 0539       Component Value Date/Time   WBC 11.0 (H) 12/17/2020 0216   RBC 3.11 (L) 12/17/2020 0216   HGB 9.6 (L) 12/17/2020 0216   HCT 28.8 (L) 12/17/2020 0216   PLT 184 12/17/2020 0216   MCV 92.6 12/17/2020 0216   MCH 30.9 12/17/2020 0216   MCHC 33.3 12/17/2020 0216   RDW 12.5 12/17/2020 0216    No results found for: "POCLITH", "LITHIUM"   No results found for: "PHENYTOIN", "PHENOBARB", "VALPROATE", "CBMZ"   .res Assessment: Plan:    Will continue current plan of care since target signs and symptoms are well controlled without any tolerability issues. Continue Sertraline 75 mg po qd for depression and anxiety.  Continue Wellbutrin XL 150 mg daily for depression.  Pt to follow-up in one year or sooner if clinically indicated. Patient advised to contact office with any questions, adverse effects, or acute worsening in signs and symptoms.   Susan Zamora was seen today for follow-up.  Diagnoses and all orders for this visit:  Recurrent major depressive disorder, in full remission ([place]) -     buPROPion (WELLBUTRIN XL) 150 MG 24 hr tablet; TAKE 1 TABLET(150 MG) BY MOUTH DAILY  Premenstrual dysphoric disorder -     sertraline (ZOLOFT) 50 MG tablet; Take 1.5 tablets (75 mg total) by mouth daily.  Anxiety disorder, unspecified type -     sertraline (ZOLOFT) 50 MG tablet; Take 1.5 tablets (75 mg total) by mouth daily.     Please see  After Visit Summary for patient specific instructions.  Future Appointments  Date Time Provider [place]  07/07/2023 10:00 AM Thayer Headings, PMHNP CP-CP None    No orders of the defined types were placed in this encounter.   -------------------------------

## 2022-12-20 ENCOUNTER — Other Ambulatory Visit (HOSPITAL_BASED_OUTPATIENT_CLINIC_OR_DEPARTMENT_OTHER): Payer: Self-pay | Admitting: Physician Assistant

## 2022-12-20 ENCOUNTER — Ambulatory Visit (HOSPITAL_BASED_OUTPATIENT_CLINIC_OR_DEPARTMENT_OTHER)
Admission: RE | Admit: 2022-12-20 | Discharge: 2022-12-20 | Disposition: A | Discharge status: Home / Self Care | Payer: PRIVATE HEALTH INSURANCE | Source: Ambulatory Visit | Attending: Physician Assistant | Admitting: Physician Assistant

## 2022-12-20 DIAGNOSIS — R221 Localized swelling, mass and lump, neck: Secondary | ICD-10-CM | POA: Insufficient documentation

## 2023-02-09 ENCOUNTER — Other Ambulatory Visit: Payer: Self-pay | Admitting: Family Medicine

## 2023-02-09 DIAGNOSIS — E041 Nontoxic single thyroid nodule: Secondary | ICD-10-CM

## 2023-02-09 DIAGNOSIS — Z1231 Encounter for screening mammogram for malignant neoplasm of breast: Secondary | ICD-10-CM

## 2023-02-20 ENCOUNTER — Ambulatory Visit
Admission: RE | Admit: 2023-02-20 | Discharge: 2023-02-20 | Disposition: A | Discharge status: Home / Self Care | Payer: Managed Care, Other (non HMO) | Source: Ambulatory Visit | Attending: Family Medicine | Admitting: Family Medicine

## 2023-02-20 DIAGNOSIS — E041 Nontoxic single thyroid nodule: Secondary | ICD-10-CM

## 2023-03-02 ENCOUNTER — Encounter: Payer: Self-pay | Admitting: Psychiatry

## 2023-03-02 ENCOUNTER — Ambulatory Visit (INDEPENDENT_AMBULATORY_CARE_PROVIDER_SITE_OTHER): Payer: No Typology Code available for payment source | Admitting: Psychiatry

## 2023-03-02 VITALS — BP 139/90 | HR 69

## 2023-03-02 DIAGNOSIS — F3342 Major depressive disorder, recurrent, in full remission: Secondary | ICD-10-CM

## 2023-03-02 DIAGNOSIS — F419 Anxiety disorder, unspecified: Secondary | ICD-10-CM

## 2023-03-02 DIAGNOSIS — F988 Other specified behavioral and emotional disorders with onset usually occurring in childhood and adolescence: Secondary | ICD-10-CM

## 2023-03-02 MED ORDER — LISDEXAMFETAMINE DIMESYLATE 30 MG PO CAPS
30.0000 mg | ORAL_CAPSULE | Freq: Every day | ORAL | 0 refills | Status: DC
Start: 2023-03-02 — End: 2023-03-30

## 2023-03-02 NOTE — Progress Notes (Signed)
Susan Zamora [id number] [birth year] 42 y.o.  Subjective:   Patient ID:  Susan Zamora is a 42 y.o. (DOB 06-20-1981) female.  Chief Complaint:  Chief Complaint  Patient presents with   Other    Difficulty with concentration    HPI Susan Zamora presents to the office today for worsening concentration and anxiety. She reports about a month ago she had some episodes of nausea with her medication. She reports that she stopped Sertraline since it seemed to be causing nausea. She has continued to take Wellbutrin XL. She reports that her mood is "frustrated" and has some sadness in response to not functioning at the level she would like.   She reports severe difficulty with concentration for several months, to include prior to stopping Sertraline. She reports, "I cannot focus at all, very forgetful." She reports that she has to go back into the house 4 times before leaving. She reports that she has not been able to go to the grocery store "because my brain feels like a ping pong ball all the time" and is having difficulty making decisions. She reports that she has been feeling very over-stimulated with minimal stimuli. She reports that over-stimulation is causing increased anxiety.  She reports that she is "going to miss something," such as a safety issue with her kids. She reports, "nervous energy." She reports adequate motivation.   She reports that she has always had difficulty with focus and with daydreaming. She reports that as a child she was concerned about performance and "people-pleasing" and tried to over-compensate. She reports in the last few months she has been losing and misplacing things. She has also been having difficulty keeping up with appointments. She reports procrastination when she is not sure how to start a task. She reports, "I can't read anymore" due to worsening concentration. She reports difficulty sitting.still. Denies interrupting or interjecting.    Difficulty going to sleep and waking up a lot. Estimates sleeping 4 hours a night. She reports that she has been overeating- "because when I don;t know what to do, I eat." Denies SI.   She reports that her mother was diagnosed with ADHD in her mid-40's. One sister has been on Adderall most of her lifetime. Brother takes Adderall. Another sister has also been diagnosed with ADHD.   During school days, she gets up at 6 am in the morning and stays active until 9 pm.   Flowsheet Row Admission (Discharged) from 12/16/2020 in [facility]  C-SSRS RISK CATEGORY No Risk        Review of Systems:  Review of Systems  Constitutional:        Hot flashes  Gastrointestinal:  Negative for nausea.  Genitourinary:        She reports that her periods are shorter and heavier. Started having menstrual cycle around age 37.   Musculoskeletal:  Negative for gait problem.  Neurological:  Negative for tremors.  Psychiatric/Behavioral:         Please refer to HPI    Medications: I have reviewed the patient's current medications.  Current Outpatient Medications  Medication Sig Dispense Refill   buPROPion (WELLBUTRIN XL) 150 MG 24 hr tablet TAKE 1 TABLET(150 MG) BY MOUTH DAILY 30 tablet 11   lisdexamfetamine (VYVANSE) 30 MG capsule Take 1 capsule (30 mg total) by mouth daily. 30 capsule 0   loratadine (CLARITIN) 10 MG tablet Take 10 mg by mouth daily.     No current facility-administered medications for this  visit.    Medication Side Effects: Nausea with Sertraline  Allergies:  Allergies  Allergen Reactions   Percocet [Oxycodone-Acetaminophen] Nausea And Vomiting   Amoxicillin Nausea And Vomiting    Has patient had a PCN reaction causing immediate rash, facial/tongue/throat swelling, SOB or lightheadedness with hypotension: No Has patient had a PCN reaction causing severe rash involving mucus membranes or skin necrosis: No Has patient had a PCN reaction that required hospitalization No Has patient  had a PCN reaction occurring within the last 10 years: Yes If all of the above answers are "NO", then may proceed with Cephalosporin use.    Codeine Nausea Only    Past Medical History:  Diagnosis Date   Abnormal Pap smear 06/2011   ASCUS   Anxiety    COVID 08/01/2020   cough and fever x 2 days all symptoms resolved   Depression    Gestational hypertension 09/28/2016   H/O varicella as child   Hyperemesis arising during pregnancy 2013   Hypertension    Vaginal delivery 09/28/2016   Vitamin D deficiency     Past Medical History, Surgical history, Social history, and Family history were reviewed and updated as appropriate.   Please see review of systems for further details on the patient's review from today.   Objective:   Physical Exam:  BP (!) 139/90   Pulse 69   Physical Exam Constitutional:      General: She is not in acute distress. Musculoskeletal:        General: No deformity.  Neurological:     Mental Status: She is alert and oriented to person, place, and time.     Coordination: Coordination normal.  Psychiatric:        Attention and Perception: Attention and perception normal. She does not perceive auditory or visual hallucinations.        Mood and Affect: Mood is not depressed. Affect is not labile, blunt, angry or inappropriate.        Speech: Speech normal.        Behavior: Behavior normal.        Thought Content: Thought content normal. Thought content is not paranoid or delusional. Thought content does not include homicidal or suicidal ideation. Thought content does not include homicidal or suicidal plan.        Cognition and Memory: Cognition and memory normal.        Judgment: Judgment normal.     Comments: Insight intact Mood presents as anxious and frustrated in response to concentration difficulties     Lab Review:     Component Value Date/Time   NA 132 (L) 12/17/2020 0216   K 3.9 12/17/2020 0216   CL 100 12/17/2020 0216   CO2 28 12/17/2020  0216   GLUCOSE 238 (H) 12/17/2020 0216   GLUCOSE 90 02/10/2012 0800   BUN 13 12/17/2020 0216   CREATININE 0.90 12/17/2020 0216   CALCIUM 8.2 (L) 12/17/2020 0216   PROT 5.0 (L) 09/30/2016 0539   ALBUMIN 2.1 (L) 09/30/2016 0539   AST 43 (H) 09/30/2016 0539   ALT 65 (H) 09/30/2016 0539   ALKPHOS 132 (H) 09/30/2016 0539   BILITOT 0.4 09/30/2016 0539   GFRNONAA >60 12/17/2020 0216   GFRAA >60 09/30/2016 0539       Component Value Date/Time   WBC 11.0 (H) 12/17/2020 0216   RBC 3.11 (L) 12/17/2020 0216   HGB 9.6 (L) 12/17/2020 0216   HCT 28.8 (L) 12/17/2020 0216   PLT 184 12/17/2020 0216  MCV 92.6 12/17/2020 0216   MCH 30.9 12/17/2020 0216   MCHC 33.3 12/17/2020 0216   RDW 12.5 12/17/2020 0216    No results found for: "POCLITH", "LITHIUM"   No results found for: "PHENYTOIN", "PHENOBARB", "VALPROATE", "CBMZ"   .res Assessment: Plan:    Discussed that ADHD diagnosis seems likely based on symptoms and strong family history (4 first degree family members that have been diagnosed with ADHD). Agreed that her anxiety seems to be specific to difficulties with concentration and how this is affecting her family. Recommend trial of low dose Vyvanse for treatment of ADHD since Vyvanse and Adderall have been effective for multiple family members. Discussed potential benefits, risks, and side effects of stimulants with patient to include increased heart rate, palpitations, insomnia, increased anxiety, increased irritability, or decreased appetite.  Instructed patient to contact office if experiencing any significant tolerability issues. Will start Vyvanse 30 mg daily for ADHD.  Will continue Wellbutrin XL 150 mg daily for depression.  Pt to follow-up with this provider in 4 weeks or sooner if clinically indicated.  Patient advised to contact office with any questions, adverse effects, or acute worsening in signs and symptoms.   Maytal Croley "[name]" was seen today for other.  Diagnoses and  all orders for this visit:  Attention deficit disorder, unspecified hyperactivity presence -     lisdexamfetamine (VYVANSE) 30 MG capsule; Take 1 capsule (30 mg total) by mouth daily.  Anxiety disorder, unspecified type  Recurrent major depressive disorder, in full remission [facility])     Please see After Visit Summary for patient specific instructions.  Future Appointments  Date Time Provider Department Center  03/30/2023  9:30 AM Corie Chiquito, PMHNP [facility] None  07/07/2023 10:00 AM Corie Chiquito, PMHNP [facility] None    No orders of the defined types were placed in this encounter.   -------------------------------

## 2023-03-30 ENCOUNTER — Encounter: Payer: Self-pay | Admitting: Psychiatry

## 2023-03-30 ENCOUNTER — Ambulatory Visit (INDEPENDENT_AMBULATORY_CARE_PROVIDER_SITE_OTHER): Payer: No Typology Code available for payment source | Admitting: Psychiatry

## 2023-03-30 VITALS — BP 139/90 | HR 74

## 2023-03-30 DIAGNOSIS — F3342 Major depressive disorder, recurrent, in full remission: Secondary | ICD-10-CM | POA: Diagnosis not present

## 2023-03-30 DIAGNOSIS — F988 Other specified behavioral and emotional disorders with onset usually occurring in childhood and adolescence: Secondary | ICD-10-CM | POA: Diagnosis not present

## 2023-03-30 DIAGNOSIS — F419 Anxiety disorder, unspecified: Secondary | ICD-10-CM

## 2023-03-30 MED ORDER — LISDEXAMFETAMINE DIMESYLATE 40 MG PO CAPS
40.0000 mg | ORAL_CAPSULE | ORAL | 0 refills | Status: DC
Start: 2023-03-30 — End: 2023-04-27

## 2023-03-30 MED ORDER — AMPHETAMINE-DEXTROAMPHETAMINE 10 MG PO TABS
ORAL_TABLET | ORAL | 0 refills | Status: DC
Start: 2023-03-30 — End: 2023-07-07

## 2023-03-30 NOTE — Progress Notes (Signed)
Susan Zamora [id number] 09/13/1980 42 y.o.  Subjective:   Patient ID:  Susan Zamora is a 42 y.o. (DOB 03/22/1981) female.  Chief Complaint:  Chief Complaint  Patient presents with   Follow-up    ADHD    HPI [name] presents to the office today for follow-up of ADHD, anxiety, and depression. She reports that she had some initial sleep disturbance with Vyvanse 30 mg daily, "and the next week was good," and the following week "got used to it" and noticed diminished response. She reports that vyvanse seems to be effective for a shorter period of time now compared to when she started Vyvanse. She reports that she took it at 11 am yesterday and effect had completely diminished by 6 pm and she was feeling overwhelmed and unable to focus at that time.  She reports that her anxiety is less when Vyvanse has been effective. She reports that she has noticed a "big difference" while driving and has been better able to focus. She has been better able to complete tasks and is procrastinating less. She reports that she is losing and misplacing things less. She reports that her mood has improved. She reports that she is feeling less forgetful. She reports that she has less difficulty making decisions when Vyvanse is effective. Noticed medication effects were diminished around her menstrual cycle and had more anxiety around that time.   Sleeping well. Sleep has improved. Appetite has been normal. Energy and motivation have been good. Denies SI.   Maternal aunt died on 05-02-23 and they are going out of town this weekend for the funeral. Celine Ahr had cancer for 4 years.   Children are doing extra-curricular activities.    Flowsheet Row Admission (Discharged) from 12/16/2020 in [facility]  C-SSRS RISK CATEGORY No Risk        Review of Systems:  Review of Systems  Cardiovascular:  Negative for chest pain and palpitations.  Musculoskeletal:  Negative for gait problem.   Psychiatric/Behavioral:         Please refer to HPI    Medications: I have reviewed the patient's current medications.  Current Outpatient Medications  Medication Sig Dispense Refill   amphetamine-dextroamphetamine (ADDERALL) 10 MG tablet Take 1/2-1 tablet daily as needed 30 tablet 0   lisdexamfetamine (VYVANSE) 40 MG capsule Take 1 capsule (40 mg total) by mouth every morning. 30 capsule 0   buPROPion (WELLBUTRIN XL) 150 MG 24 hr tablet TAKE 1 TABLET(150 MG) BY MOUTH DAILY 30 tablet 11   loratadine (CLARITIN) 10 MG tablet Take 10 mg by mouth daily.     No current facility-administered medications for this visit.    Medication Side Effects: Other: Dry mouth  Allergies:  Allergies  Allergen Reactions   Percocet [Oxycodone-Acetaminophen] Nausea And Vomiting   Amoxicillin Nausea And Vomiting    Has patient had a PCN reaction causing immediate rash, facial/tongue/throat swelling, SOB or lightheadedness with hypotension: No Has patient had a PCN reaction causing severe rash involving mucus membranes or skin necrosis: No Has patient had a PCN reaction that required hospitalization No Has patient had a PCN reaction occurring within the last 10 years: Yes If all of the above answers are "NO", then may proceed with Cephalosporin use.    Codeine Nausea Only    Past Medical History:  Diagnosis Date   Abnormal Pap smear 06/2011   ASCUS   Anxiety    COVID 08/01/2020   cough and fever x 2 days all symptoms  resolved   Depression    Gestational hypertension 09/28/2016   H/O varicella as child   Hyperemesis arising during pregnancy 2013   Hypertension    Vaginal delivery 09/28/2016   Vitamin D deficiency     Past Medical History, Surgical history, Social history, and Family history were reviewed and updated as appropriate.   Please see review of systems for further details on the patient's review from today.   Objective:   Physical Exam:  BP (!) 139/90   Pulse 74   Physical  Exam Constitutional:      General: She is not in acute distress. Musculoskeletal:        General: No deformity.  Neurological:     Mental Status: She is alert and oriented to person, place, and time.     Coordination: Coordination normal.  Psychiatric:        Attention and Perception: Attention and perception normal. She does not perceive auditory or visual hallucinations.        Mood and Affect: Mood normal. Mood is not anxious or depressed. Affect is not labile, blunt, angry or inappropriate.        Speech: Speech normal.        Behavior: Behavior normal.        Thought Content: Thought content normal. Thought content is not paranoid or delusional. Thought content does not include homicidal or suicidal ideation. Thought content does not include homicidal or suicidal plan.        Cognition and Memory: Cognition and memory normal.        Judgment: Judgment normal.     Comments: Insight intact     Lab Review:     Component Value Date/Time   NA 132 (L) 12/17/2020 0216   K 3.9 12/17/2020 0216   CL 100 12/17/2020 0216   CO2 28 12/17/2020 0216   GLUCOSE 238 (H) 12/17/2020 0216   GLUCOSE 90 02/10/2012 0800   BUN 13 12/17/2020 0216   CREATININE 0.90 12/17/2020 0216   CALCIUM 8.2 (L) 12/17/2020 0216   PROT 5.0 (L) 09/30/2016 0539   ALBUMIN 2.1 (L) 09/30/2016 0539   AST 43 (H) 09/30/2016 0539   ALT 65 (H) 09/30/2016 0539   ALKPHOS 132 (H) 09/30/2016 0539   BILITOT 0.4 09/30/2016 0539   GFRNONAA >60 12/17/2020 0216   GFRAA >60 09/30/2016 0539       Component Value Date/Time   WBC 11.0 (H) 12/17/2020 0216   RBC 3.11 (L) 12/17/2020 0216   HGB 9.6 (L) 12/17/2020 0216   HCT 28.8 (L) 12/17/2020 0216   PLT 184 12/17/2020 0216   MCV 92.6 12/17/2020 0216   MCH 30.9 12/17/2020 0216   MCHC 33.3 12/17/2020 0216   RDW 12.5 12/17/2020 0216    No results found for: "POCLITH", "LITHIUM"   No results found for: "PHENYTOIN", "PHENOBARB", "VALPROATE", "CBMZ"   .res Assessment: Plan:     Will increase Vyvanse to 40 mg po every day to improve ADHD symptoms since she reports partial response to 30 mg daily.  Will also add Adderall 10 mg 1/2-1 tab po every day prn in the afternoon since she is continuing to care for 3 children and many evenings they are engaged in activities.  Continue Wellbutrin XL  150 mg daily for depression.  Pt to follow-up with this provider in 4 weeks or sooner if clinically indicated.  Patient advised to contact office with any questions, adverse effects, or acute worsening in signs and symptoms.   Viann Shove "  [name]" was seen today for follow-up.  Diagnoses and all orders for this visit:  Attention deficit disorder, unspecified hyperactivity presence -     lisdexamfetamine (VYVANSE) 40 MG capsule; Take 1 capsule (40 mg total) by mouth every morning. -     amphetamine-dextroamphetamine (ADDERALL) 10 MG tablet; Take 1/2-1 tablet daily as needed  Anxiety disorder, unspecified type  Recurrent major depressive disorder, in full remission [facility])     Please see After Visit Summary for patient specific instructions.  Future Appointments  Date Time Provider Department Center  04/27/2023  9:30 AM Corie Chiquito, PMHNP [facility] None  07/07/2023 10:00 AM Corie Chiquito, PMHNP [facility] None    No orders of the defined types were placed in this encounter.   -------------------------------

## 2023-04-27 ENCOUNTER — Encounter: Payer: Self-pay | Admitting: Psychiatry

## 2023-04-27 ENCOUNTER — Ambulatory Visit (INDEPENDENT_AMBULATORY_CARE_PROVIDER_SITE_OTHER): Payer: No Typology Code available for payment source | Admitting: Psychiatry

## 2023-04-27 VITALS — BP 138/93 | HR 80

## 2023-04-27 DIAGNOSIS — F988 Other specified behavioral and emotional disorders with onset usually occurring in childhood and adolescence: Secondary | ICD-10-CM | POA: Diagnosis not present

## 2023-04-27 DIAGNOSIS — F3342 Major depressive disorder, recurrent, in full remission: Secondary | ICD-10-CM | POA: Diagnosis not present

## 2023-04-27 DIAGNOSIS — F419 Anxiety disorder, unspecified: Secondary | ICD-10-CM

## 2023-04-27 MED ORDER — LISDEXAMFETAMINE DIMESYLATE 40 MG PO CAPS
40.0000 mg | ORAL_CAPSULE | ORAL | 0 refills | Status: DC
Start: 2023-06-24 — End: 2023-07-07

## 2023-04-27 MED ORDER — LISDEXAMFETAMINE DIMESYLATE 40 MG PO CAPS
40.0000 mg | ORAL_CAPSULE | ORAL | 0 refills | Status: DC
Start: 2023-05-27 — End: 2023-06-09

## 2023-04-27 MED ORDER — BUPROPION HCL ER (XL) 150 MG PO TB24
ORAL_TABLET | ORAL | 11 refills | Status: DC
Start: 2023-04-27 — End: 2023-07-07

## 2023-04-27 MED ORDER — LISDEXAMFETAMINE DIMESYLATE 40 MG PO CAPS
40.0000 mg | ORAL_CAPSULE | ORAL | 0 refills | Status: AC
Start: 2023-04-29 — End: ?

## 2023-05-03 ENCOUNTER — Ambulatory Visit
Admission: RE | Admit: 2023-05-03 | Discharge: 2023-05-03 | Disposition: A | Discharge status: Home / Self Care | Payer: No Typology Code available for payment source | Source: Ambulatory Visit | Attending: Family Medicine | Admitting: Family Medicine

## 2023-05-03 ENCOUNTER — Other Ambulatory Visit: Payer: Self-pay

## 2023-05-03 ENCOUNTER — Telehealth: Payer: Self-pay | Admitting: Psychiatry

## 2023-05-03 DIAGNOSIS — F988 Other specified behavioral and emotional disorders with onset usually occurring in childhood and adolescence: Secondary | ICD-10-CM

## 2023-05-03 DIAGNOSIS — Z1231 Encounter for screening mammogram for malignant neoplasm of breast: Secondary | ICD-10-CM

## 2023-05-03 NOTE — Telephone Encounter (Signed)
Pended to requested pharmacy. Need to cancel at [facility] [phone].

## 2023-05-03 NOTE — Telephone Encounter (Signed)
Pt called and said that she needs the generic vyvanse 40 mg sent to the community [facility] at [address]. They are the only pharmacy that has it in stock

## 2023-05-04 ENCOUNTER — Other Ambulatory Visit (HOSPITAL_BASED_OUTPATIENT_CLINIC_OR_DEPARTMENT_OTHER): Payer: Self-pay

## 2023-05-04 ENCOUNTER — Other Ambulatory Visit: Payer: Self-pay

## 2023-05-04 MED ORDER — LISDEXAMFETAMINE DIMESYLATE 40 MG PO CAPS
40.0000 mg | ORAL_CAPSULE | ORAL | 0 refills | Status: DC
Start: 2023-05-04 — End: 2023-07-07
  Filled 2023-05-04: qty 30, 30d supply, fill #0

## 2023-05-04 NOTE — Telephone Encounter (Signed)
Canceled at [facility].  

## 2023-05-05 ENCOUNTER — Other Ambulatory Visit (HOSPITAL_BASED_OUTPATIENT_CLINIC_OR_DEPARTMENT_OTHER): Payer: Self-pay

## 2023-06-09 ENCOUNTER — Other Ambulatory Visit (HOSPITAL_BASED_OUTPATIENT_CLINIC_OR_DEPARTMENT_OTHER): Payer: Self-pay

## 2023-06-09 ENCOUNTER — Telehealth: Payer: Self-pay | Admitting: Psychiatry

## 2023-06-09 ENCOUNTER — Other Ambulatory Visit: Payer: Self-pay

## 2023-06-09 DIAGNOSIS — F988 Other specified behavioral and emotional disorders with onset usually occurring in childhood and adolescence: Secondary | ICD-10-CM

## 2023-06-09 MED ORDER — LISDEXAMFETAMINE DIMESYLATE 40 MG PO CAPS
40.0000 mg | ORAL_CAPSULE | ORAL | 0 refills | Status: DC
Start: 2023-06-09 — End: 2023-07-07
  Filled 2023-06-09: qty 30, 30d supply, fill #0

## 2023-06-09 NOTE — Telephone Encounter (Signed)
[name] called at 10>35 to report that the [name] still does not the Vyvanse.  Please transfer to [name] where she got it last time.

## 2023-06-09 NOTE — Telephone Encounter (Signed)
Canceled at [facility], repended for [facility]

## 2023-06-12 ENCOUNTER — Other Ambulatory Visit (HOSPITAL_BASED_OUTPATIENT_CLINIC_OR_DEPARTMENT_OTHER): Payer: Self-pay

## 2023-06-14 ENCOUNTER — Encounter: Payer: Self-pay | Admitting: Psychiatry

## 2023-07-07 ENCOUNTER — Telehealth: Payer: Self-pay | Admitting: Psychiatry

## 2023-07-07 ENCOUNTER — Other Ambulatory Visit (HOSPITAL_BASED_OUTPATIENT_CLINIC_OR_DEPARTMENT_OTHER): Payer: Self-pay

## 2023-07-07 ENCOUNTER — Ambulatory Visit (INDEPENDENT_AMBULATORY_CARE_PROVIDER_SITE_OTHER): Payer: No Typology Code available for payment source | Admitting: Psychiatry

## 2023-07-07 ENCOUNTER — Encounter: Payer: Self-pay | Admitting: Psychiatry

## 2023-07-07 DIAGNOSIS — F909 Attention-deficit hyperactivity disorder, unspecified type: Secondary | ICD-10-CM

## 2023-07-07 DIAGNOSIS — F3342 Major depressive disorder, recurrent, in full remission: Secondary | ICD-10-CM | POA: Diagnosis not present

## 2023-07-07 DIAGNOSIS — F419 Anxiety disorder, unspecified: Secondary | ICD-10-CM

## 2023-07-07 MED ORDER — AMPHETAMINE-DEXTROAMPHET ER 10 MG PO CP24
10.0000 mg | ORAL_CAPSULE | Freq: Every day | ORAL | 0 refills | Status: DC
Start: 2023-07-07 — End: 2023-08-04
  Filled 2023-07-07: qty 4, 4d supply, fill #0
  Filled 2023-07-07: qty 26, 26d supply, fill #0

## 2023-07-07 MED ORDER — AMPHETAMINE-DEXTROAMPHETAMINE 10 MG PO TABS
5.0000 mg | ORAL_TABLET | Freq: Every day | ORAL | 0 refills | Status: DC | PRN
Start: 2023-07-07 — End: 2023-08-04
  Filled 2023-07-07: qty 30, 30d supply, fill #0

## 2023-07-07 MED ORDER — BUPROPION HCL ER (XL) 300 MG PO TB24
300.0000 mg | ORAL_TABLET | Freq: Every day | ORAL | 2 refills | Status: DC
Start: 2023-07-07 — End: 2023-08-04
  Filled 2023-07-07: qty 30, 30d supply, fill #0

## 2023-07-07 NOTE — Progress Notes (Signed)
Susan Zamora [id number] Oct 25, 1980 42 y.o.  Subjective:   Patient ID:  Susan Zamora is a 42 y.o. (DOB 1981/04/22) female.  Chief Complaint:  Chief Complaint  Patient presents with   Depression   ADHD    HPI [name] presents to the office today for follow-up of ADHD and depression. She reports that she has been, "doing ok." She reports that she is needing to take Adderall 1/2-1 tablet every afternoon since Vyvanse is not as effective. She reports Adderall 5 mg works well and has a shorter duration (about 3 hours). She reports that she has only taken 10 mg on a few occasions and has found this to be helpful. She notices that she is "not wanting to" do certain tasks.  She notices some increased depression in response to having increased difficulty with concentration and lower motivation. Reports sadness is "starting to creep back in." Denies persistent sad mood. She reports anxiety has been ok. Energy has been ok. Sleeping well. Appetite has been good. Denies SI.   Past Psychiatric Medication Trials: Wellbutrin  Sertraline- Effective and then seemed to cause nausea Vyvanse-Ineffective Adderall Ambien  Flowsheet Row Admission (Discharged) from 12/16/2020 in [facility]  C-SSRS RISK CATEGORY No Risk        Review of Systems:  Review of Systems  HENT:  Positive for voice change.   Musculoskeletal:  Negative for gait problem.  Psychiatric/Behavioral:         Please refer to HPI    Medications: I have reviewed the patient's current medications.  Current Outpatient Medications  Medication Sig Dispense Refill   amphetamine-dextroamphetamine (ADDERALL XR) 10 MG 24 hr capsule Take 1 capsule (10 mg total) by mouth daily. 30 capsule 0   loratadine (CLARITIN) 10 MG tablet Take 10 mg by mouth daily.     amphetamine-dextroamphetamine (ADDERALL) 10 MG tablet Take ONE-HALF to 1 tablets (5-10 mg total) by mouth daily as needed. 30 tablet 0   buPROPion (WELLBUTRIN XL)  300 MG 24 hr tablet Take 1 tablet (300 mg total) by mouth daily. 30 tablet 2   No current facility-administered medications for this visit.    Medication Side Effects: None  Allergies:  Allergies  Allergen Reactions   Percocet [Oxycodone-Acetaminophen] Nausea And Vomiting   Amoxicillin Nausea And Vomiting    Has patient had a PCN reaction causing immediate rash, facial/tongue/throat swelling, SOB or lightheadedness with hypotension: No Has patient had a PCN reaction causing severe rash involving mucus membranes or skin necrosis: No Has patient had a PCN reaction that required hospitalization No Has patient had a PCN reaction occurring within the last 10 years: Yes If all of the above answers are "NO", then may proceed with Cephalosporin use.    Codeine Nausea Only    Past Medical History:  Diagnosis Date   Abnormal Pap smear 06/2011   ASCUS   Anxiety    COVID 08/01/2020   cough and fever x 2 days all symptoms resolved   Depression    Gestational hypertension 09/28/2016   H/O varicella as child   Hyperemesis arising during pregnancy 2013   Hypertension    Vaginal delivery 09/28/2016   Vitamin D deficiency     Past Medical History, Surgical history, Social history, and Family history were reviewed and updated as appropriate.   Please see review of systems for further details on the patient's review from today.   Objective:   Physical Exam:  There were no vitals taken for this  visit.  Physical Exam Constitutional:      General: She is not in acute distress. Musculoskeletal:        General: No deformity.  Neurological:     Mental Status: She is alert and oriented to person, place, and time.     Coordination: Coordination normal.  Psychiatric:        Attention and Perception: Attention and perception normal. She does not perceive auditory or visual hallucinations.        Mood and Affect: Mood is not anxious. Affect is not labile, blunt, angry or inappropriate.         Speech: Speech normal.        Behavior: Behavior normal.        Thought Content: Thought content normal. Thought content is not paranoid or delusional. Thought content does not include homicidal or suicidal ideation. Thought content does not include homicidal or suicidal plan.        Cognition and Memory: Cognition and memory normal.        Judgment: Judgment normal.     Comments: Insight intact Mood is mildly depressed     Lab Review:     Component Value Date/Time   NA 132 (L) 12/17/2020 0216   K 3.9 12/17/2020 0216   CL 100 12/17/2020 0216   CO2 28 12/17/2020 0216   GLUCOSE 238 (H) 12/17/2020 0216   GLUCOSE 90 02/10/2012 0800   BUN 13 12/17/2020 0216   CREATININE 0.90 12/17/2020 0216   CALCIUM 8.2 (L) 12/17/2020 0216   PROT 5.0 (L) 09/30/2016 0539   ALBUMIN 2.1 (L) 09/30/2016 0539   AST 43 (H) 09/30/2016 0539   ALT 65 (H) 09/30/2016 0539   ALKPHOS 132 (H) 09/30/2016 0539   BILITOT 0.4 09/30/2016 0539   GFRNONAA >60 12/17/2020 0216   GFRAA >60 09/30/2016 0539       Component Value Date/Time   WBC 11.0 (H) 12/17/2020 0216   RBC 3.11 (L) 12/17/2020 0216   HGB 9.6 (L) 12/17/2020 0216   HCT 28.8 (L) 12/17/2020 0216   PLT 184 12/17/2020 0216   MCV 92.6 12/17/2020 0216   MCH 30.9 12/17/2020 0216   MCHC 33.3 12/17/2020 0216   RDW 12.5 12/17/2020 0216    No results found for: "POCLITH", "LITHIUM"   No results found for: "PHENYTOIN", "PHENOBARB", "VALPROATE", "CBMZ"   .res Assessment: Plan:    Will discontinue Vyvanse since pt reports that she has had difficulty obtaining Vyvanse and that Vyvanse has not been as effective as Adderall.  Will start Adderall XR 10 mg daily for ADHD.  Will continue Adderall 10 mg 1/2-1 tab daily as needed for ADHD.  Will increase Wellbutrin XL to 300 mg daily for depression.  Pt to follow-up with this provider in 4 weeks or sooner if clinically indicated.  Patient advised to contact office with any questions, adverse effects, or acute  worsening in signs and symptoms.   [name] "[name]" was seen today for depression and adhd.  Diagnoses and all orders for this visit:  Attention deficit hyperactivity disorder (ADHD), unspecified ADHD type -     amphetamine-dextroamphetamine (ADDERALL) 10 MG tablet; Take ONE-HALF to 1 tablets (5-10 mg total) by mouth daily as needed. -     amphetamine-dextroamphetamine (ADDERALL XR) 10 MG 24 hr capsule; Take 1 capsule (10 mg total) by mouth daily.  Recurrent major depressive disorder, in full remission (HCC) -     buPROPion (WELLBUTRIN XL) 300 MG 24 hr tablet; Take 1 tablet (300  mg total) by mouth daily.  Anxiety disorder, unspecified type     Please see After Visit Summary for patient specific instructions.  Future Appointments  Date Time Provider Department Center  08/04/2023 10:30 AM Corie Chiquito, PMHNP [facility] None    No orders of the defined types were placed in this encounter.   -------------------------------

## 2023-07-07 NOTE — Telephone Encounter (Signed)
MyChart message sent to patient.

## 2023-08-04 ENCOUNTER — Encounter: Payer: Self-pay | Admitting: Psychiatry

## 2023-08-04 ENCOUNTER — Other Ambulatory Visit (HOSPITAL_BASED_OUTPATIENT_CLINIC_OR_DEPARTMENT_OTHER): Payer: Self-pay

## 2023-08-04 ENCOUNTER — Telehealth (INDEPENDENT_AMBULATORY_CARE_PROVIDER_SITE_OTHER): Payer: No Typology Code available for payment source | Admitting: Psychiatry

## 2023-08-04 DIAGNOSIS — F909 Attention-deficit hyperactivity disorder, unspecified type: Secondary | ICD-10-CM

## 2023-08-04 DIAGNOSIS — F3342 Major depressive disorder, recurrent, in full remission: Secondary | ICD-10-CM

## 2023-08-04 MED ORDER — AMPHETAMINE-DEXTROAMPHET ER 10 MG PO CP24
10.0000 mg | ORAL_CAPSULE | Freq: Every day | ORAL | 0 refills | Status: AC
Start: 2023-08-04 — End: ?
  Filled 2023-08-04 – 2023-08-25 (×3): qty 30, 30d supply, fill #0

## 2023-08-04 MED ORDER — AMPHETAMINE-DEXTROAMPHET ER 10 MG PO CP24
10.0000 mg | ORAL_CAPSULE | Freq: Every day | ORAL | 0 refills | Status: AC
Start: 2023-09-29 — End: ?

## 2023-08-04 MED ORDER — AMPHETAMINE-DEXTROAMPHETAMINE 10 MG PO TABS
ORAL_TABLET | ORAL | 0 refills | Status: AC
Start: 2023-09-29 — End: ?

## 2023-08-04 MED ORDER — BUPROPION HCL ER (XL) 300 MG PO TB24
300.0000 mg | ORAL_TABLET | Freq: Every day | ORAL | 1 refills | Status: AC
Start: 2023-08-04 — End: ?
  Filled 2023-08-04 – 2023-08-25 (×3): qty 90, 90d supply, fill #0

## 2023-08-04 MED ORDER — AMPHETAMINE-DEXTROAMPHETAMINE 10 MG PO TABS
5.0000 mg | ORAL_TABLET | Freq: Every day | ORAL | 0 refills | Status: AC | PRN
Start: 2023-08-04 — End: ?
  Filled 2023-08-04 – 2023-08-25 (×3): qty 30, 30d supply, fill #0

## 2023-08-04 MED ORDER — AMPHETAMINE-DEXTROAMPHET ER 10 MG PO CP24
10.0000 mg | ORAL_CAPSULE | Freq: Every day | ORAL | 0 refills | Status: AC
Start: 2023-09-01 — End: ?

## 2023-08-04 MED ORDER — AMPHETAMINE-DEXTROAMPHETAMINE 10 MG PO TABS
ORAL_TABLET | ORAL | 0 refills | Status: AC
Start: 2023-09-01 — End: ?

## 2023-08-04 NOTE — Progress Notes (Signed)
 Susan Zamora [id number] 1981-04-21 43 y.o.  Virtual Visit via Video Note  I connected with pt @ on 08/04/23 at 10:30 AM EST by a video enabled telemedicine application and verified that I am speaking with the correct person using two identifiers.   I discussed the limitations of evaluation and management by telemedicine and the availability of in person appointments. The patient expressed understanding and agreed to proceed.  I discussed the assessment and treatment plan with the patient. The patient was provided an opportunity to ask questions and all were answered. The patient agreed with the plan and demonstrated an understanding of the instructions.   The patient was advised to call back or seek an in-person evaluation if the symptoms worsen or if the condition fails to improve as anticipated.  I provided 17 minutes of non-face-to-face time during this encounter.  The patient was located at home.  The provider was located at home.   Harlene Pepper, PMHNP   Subjective:   Patient ID:  Susan Zamora is a 43 y.o. (DOB 03-Dec-1980) female.  Chief Complaint:  Chief Complaint  Patient presents with   Follow-up    ADHD and depression    HPI Susan Zamora presents for follow-up of depression and ADHD. She reports that increase in Wellbutrin  XL has been helpful for depression. She reports that she noticed an almost immediate improvement in mood. She reports that Adderall XR seems to be effective for her ADHD symptoms. She reports feeling much more even. She has been taking Adderall XR around 9:30 am and that it seems to be effective all day. She reports that she will start getting up earlier when children return to school. She denies feeling as overwhelmed with stressors. Anxiety has been good. She reports that she has had less difficulty going to sleep. Appetite has been ok. Energy has been good. Motivation has improved. Concentration has improved and able to complete  tasks. Denies SI.   Husband was sick all through the holidays.   Adderall XR and IR last filled 07/07/23.    Past Psychiatric Medication Trials: Wellbutrin   Sertraline - Effective and then seemed to cause nausea Vyvanse -Ineffective Adderall Ambien   Review of Systems:  Review of Systems  HENT:  Positive for congestion.   Respiratory:  Positive for cough.   Musculoskeletal:  Negative for gait problem.  Neurological:  Negative for tremors and headaches.  Psychiatric/Behavioral:         Please refer to HPI    Medications: I have reviewed the patient's current medications.  Current Outpatient Medications  Medication Sig Dispense Refill   [START ON 09/01/2023] amphetamine -dextroamphetamine  (ADDERALL XR) 10 MG 24 hr capsule Take 1 capsule (10 mg total) by mouth daily. 30 capsule 0   [START ON 09/29/2023] amphetamine -dextroamphetamine  (ADDERALL XR) 10 MG 24 hr capsule Take 1 capsule (10 mg total) by mouth daily. 30 capsule 0   [START ON 09/01/2023] amphetamine -dextroamphetamine  (ADDERALL) 10 MG tablet Take 1/2-1 tablet by mouth once daily. 30 tablet 0   [START ON 09/29/2023] amphetamine -dextroamphetamine  (ADDERALL) 10 MG tablet Take 1/2-1 tab po qd 30 tablet 0   loratadine (CLARITIN) 10 MG tablet Take 10 mg by mouth daily.     amphetamine -dextroamphetamine  (ADDERALL XR) 10 MG 24 hr capsule Take 1 capsule (10 mg total) by mouth daily. 30 capsule 0   amphetamine -dextroamphetamine  (ADDERALL) 10 MG tablet Take ONE-HALF to 1 tablets (5-10 mg total) by mouth daily as needed. 30 tablet 0   buPROPion  (WELLBUTRIN  XL) 300  MG 24 hr tablet Take 1 tablet (300 mg total) by mouth daily. 90 tablet 1   No current facility-administered medications for this visit.    Medication Side Effects: None  Allergies:  Allergies  Allergen Reactions   Percocet [Oxycodone -Acetaminophen ] Nausea And Vomiting   Amoxicillin Nausea And Vomiting    Has patient had a PCN reaction causing immediate rash,  facial/tongue/throat swelling, SOB or lightheadedness with hypotension: No Has patient had a PCN reaction causing severe rash involving mucus membranes or skin necrosis: No Has patient had a PCN reaction that required hospitalization No Has patient had a PCN reaction occurring within the last 10 years: Yes If all of the above answers are NO, then may proceed with Cephalosporin use.    Codeine Nausea Only    Past Medical History:  Diagnosis Date   Abnormal Pap smear 06/2011   ASCUS   Anxiety    COVID 08/01/2020   cough and fever x 2 days all symptoms resolved   Depression    Gestational hypertension 09/28/2016   H/O varicella as child   Hyperemesis arising during pregnancy 2013   Hypertension    Vaginal delivery 09/28/2016   Vitamin D deficiency     Family History  Problem Relation Age of Onset   ADD / ADHD Mother    Depression Mother    Alcohol abuse Father    Prostate cancer Father    Kidney disease Sister    Alcohol abuse Sister    Depression Sister    ADD / ADHD Sister    ADD / ADHD Sister    Learning disabilities Sister    Alcohol abuse Brother    ADD / ADHD Brother    Depression Maternal Aunt    Depression Paternal Aunt    Depression Paternal Grandmother    Thyroid  disease Neg Hx     Social History   Socioeconomic History   Marital status: Married    Spouse name: Not on file   Number of children: Not on file   Years of education: Not on file   Highest education level: Not on file  Occupational History   Not on file  Tobacco Use   Smoking status: Former    Current packs/day: 0.25    Average packs/day: 0.3 packs/day for 2.0 years (0.5 ttl pk-yrs)    Types: Cigarettes   Smokeless tobacco: Never   Tobacco comments:    in college   Vaping Use   Vaping status: Never Used  Substance and Sexual Activity   Alcohol use: Not Currently   Drug use: No   Sexual activity: Yes    Birth control/protection: None    Comment: approx 15 wks - stopped growing 8  wks ago  Other Topics Concern   Not on file  Social History Narrative   Not on file   Social Drivers of Health   Financial Resource Strain: Not on file  Food Insecurity: Not on file  Transportation Needs: Not on file  Physical Activity: Not on file  Stress: Not on file  Social Connections: Unknown (11/30/2021)   Received from [facility]   Social Network    Social Network: Not on file  Intimate Partner Violence: Unknown (11/04/2021)   Received from [facility]   HITS    Physically Hurt: Not on file    Insult or Talk Down To: Not on file    Threaten Physical Harm: Not on file    Scream or Curse: Not on  file    Past Medical History, Surgical history, Social history, and Family history were reviewed and updated as appropriate.   Please see review of systems for further details on the patient's review from today.   Objective:   Physical Exam:  BP 117/86   Physical Exam Neurological:     Mental Status: She is alert and oriented to person, place, and time.     Cranial Nerves: No dysarthria.  Psychiatric:        Attention and Perception: Attention and perception normal.        Mood and Affect: Mood normal.        Speech: Speech normal.        Behavior: Behavior is cooperative.        Thought Content: Thought content normal. Thought content is not paranoid or delusional. Thought content does not include homicidal or suicidal ideation. Thought content does not include homicidal or suicidal plan.        Cognition and Memory: Cognition and memory normal.        Judgment: Judgment normal.     Comments: Insight intact     Lab Review:     Component Value Date/Time   NA 132 (L) 12/17/2020 0216   K 3.9 12/17/2020 0216   CL 100 12/17/2020 0216   CO2 28 12/17/2020 0216   GLUCOSE 238 (H) 12/17/2020 0216   GLUCOSE 90 02/10/2012 0800   BUN 13 12/17/2020 0216   CREATININE 0.90 12/17/2020 0216   CALCIUM  8.2 (L) 12/17/2020 0216   PROT 5.0 (L)  09/30/2016 0539   ALBUMIN  2.1 (L) 09/30/2016 0539   AST 43 (H) 09/30/2016 0539   ALT 65 (H) 09/30/2016 0539   ALKPHOS 132 (H) 09/30/2016 0539   BILITOT 0.4 09/30/2016 0539   GFRNONAA >60 12/17/2020 0216   GFRAA >60 09/30/2016 0539       Component Value Date/Time   WBC 11.0 (H) 12/17/2020 0216   RBC 3.11 (L) 12/17/2020 0216   HGB 9.6 (L) 12/17/2020 0216   HCT 28.8 (L) 12/17/2020 0216   PLT 184 12/17/2020 0216   MCV 92.6 12/17/2020 0216   MCH 30.9 12/17/2020 0216   MCHC 33.3 12/17/2020 0216   RDW 12.5 12/17/2020 0216    No results found for: POCLITH, LITHIUM   No results found for: PHENYTOIN, PHENOBARB, VALPROATE, CBMZ   .res Assessment: Plan:   Will continue current plan of care since target signs and symptoms are well controlled without any tolerability issues. Continue Wellbutrin  XL 300 mg daily for depression.  Continue Adderall XR 10 mg daily for ADHD.  Continue Adderall IR 10 mg 1/2-1 tablet daily as needed for ADHD.  Pt to follow-up in 3 months or sooner if clinically indicated.  Patient advised to contact office with any questions, adverse effects, or acute worsening in signs and symptoms.   Susan Zamora was seen today for follow-up.  Diagnoses and all orders for this visit:  Attention deficit hyperactivity disorder (ADHD), unspecified ADHD type -     amphetamine -dextroamphetamine  (ADDERALL) 10 MG tablet; Take ONE-HALF to 1 tablets (5-10 mg total) by mouth daily as needed. -     amphetamine -dextroamphetamine  (ADDERALL XR) 10 MG 24 hr capsule; Take 1 capsule (10 mg total) by mouth daily. -     amphetamine -dextroamphetamine  (ADDERALL XR) 10 MG 24 hr capsule; Take 1 capsule (10 mg total) by mouth daily. -     amphetamine -dextroamphetamine  (ADDERALL XR) 10 MG 24 hr capsule; Take 1 capsule (10 mg total)  by mouth daily. -     amphetamine -dextroamphetamine  (ADDERALL) 10 MG tablet; Take 1/2-1 tablet by mouth once daily. -      amphetamine -dextroamphetamine  (ADDERALL) 10 MG tablet; Take 1/2-1 tab po qd  Recurrent major depressive disorder, in full remission (HCC) -     buPROPion  (WELLBUTRIN  XL) 300 MG 24 hr tablet; Take 1 tablet (300 mg total) by mouth daily.     Please see After Visit Summary for patient specific instructions.  No future appointments.   No orders of the defined types were placed in this encounter.     -------------------------------

## 2023-08-11 ENCOUNTER — Ambulatory Visit: Payer: No Typology Code available for payment source | Admitting: Psychiatry

## 2023-08-11 ENCOUNTER — Other Ambulatory Visit (HOSPITAL_BASED_OUTPATIENT_CLINIC_OR_DEPARTMENT_OTHER): Payer: Self-pay

## 2023-08-21 ENCOUNTER — Other Ambulatory Visit (HOSPITAL_BASED_OUTPATIENT_CLINIC_OR_DEPARTMENT_OTHER): Payer: Self-pay

## 2023-08-21 MED ORDER — TRETINOIN 0.025 % EX CREA
1.0000 | TOPICAL_CREAM | Freq: Every evening | CUTANEOUS | 0 refills | Status: AC
  Filled 2023-08-21: qty 45, 30d supply, fill #0
  Filled 2023-08-25: qty 20, 20d supply, fill #0

## 2023-08-21 MED ORDER — CLINDAMYCIN PHOSPHATE 1 % EX GEL
1.0000 | Freq: Two times a day (BID) | CUTANEOUS | 11 refills | Status: AC
  Filled 2023-08-21: qty 30, 30d supply, fill #0
  Filled 2023-08-25 (×2): qty 30, 15d supply, fill #0

## 2023-08-22 ENCOUNTER — Other Ambulatory Visit (HOSPITAL_BASED_OUTPATIENT_CLINIC_OR_DEPARTMENT_OTHER): Payer: Self-pay

## 2023-08-25 ENCOUNTER — Other Ambulatory Visit (HOSPITAL_COMMUNITY): Payer: Self-pay

## 2023-08-25 ENCOUNTER — Other Ambulatory Visit (HOSPITAL_BASED_OUTPATIENT_CLINIC_OR_DEPARTMENT_OTHER): Payer: Self-pay

## 2023-10-02 ENCOUNTER — Other Ambulatory Visit (HOSPITAL_BASED_OUTPATIENT_CLINIC_OR_DEPARTMENT_OTHER): Payer: Self-pay

## 2023-10-02 MED ORDER — AMPHETAMINE-DEXTROAMPHETAMINE 10 MG PO TABS
5.0000 mg | ORAL_TABLET | Freq: Every day | ORAL | 0 refills | Status: AC | PRN
  Filled 2023-12-20: qty 30, 30d supply, fill #0

## 2023-10-02 MED ORDER — AMPHETAMINE-DEXTROAMPHET ER 10 MG PO CP24
10.0000 mg | ORAL_CAPSULE | Freq: Every day | ORAL | 0 refills | Status: AC
  Filled 2023-12-20: qty 10, 10d supply, fill #0
  Filled 2023-12-20: qty 20, 20d supply, fill #0

## 2023-10-02 MED ORDER — AMPHETAMINE-DEXTROAMPHETAMINE 10 MG PO TABS
5.0000 mg | ORAL_TABLET | Freq: Every day | ORAL | 0 refills | Status: AC | PRN
  Filled 2023-10-02: qty 30, 30d supply, fill #0

## 2023-10-02 MED ORDER — AMPHETAMINE-DEXTROAMPHET ER 10 MG PO CP24
10.0000 mg | ORAL_CAPSULE | Freq: Every day | ORAL | 0 refills | Status: AC
  Filled 2023-10-02: qty 30, 30d supply, fill #0

## 2023-10-02 MED ORDER — AMPHETAMINE-DEXTROAMPHET ER 10 MG PO CP24: 10.0000 mg | ORAL_CAPSULE | Freq: Every day | ORAL | 0 refills | Status: AC

## 2023-10-02 MED ORDER — BUPROPION HCL ER (XL) 300 MG PO TB24
300.0000 mg | ORAL_TABLET | Freq: Every morning | ORAL | 1 refills | Status: AC
  Filled 2023-10-02 – 2023-10-03 (×2): qty 90, 90d supply, fill #0

## 2023-10-02 MED ORDER — AMPHETAMINE-DEXTROAMPHETAMINE 10 MG PO TABS: 5.0000 mg | ORAL_TABLET | Freq: Every day | ORAL | 0 refills | Status: AC | PRN

## 2023-10-03 ENCOUNTER — Other Ambulatory Visit (HOSPITAL_BASED_OUTPATIENT_CLINIC_OR_DEPARTMENT_OTHER): Payer: Self-pay

## 2023-11-30 ENCOUNTER — Other Ambulatory Visit (HOSPITAL_BASED_OUTPATIENT_CLINIC_OR_DEPARTMENT_OTHER): Payer: Self-pay

## 2023-11-30 MED ORDER — METRONIDAZOLE 1 % EX GEL
1.0000 | Freq: Every day | CUTANEOUS | 11 refills | Status: AC
  Filled 2023-11-30: qty 60, 30d supply, fill #0

## 2023-11-30 MED ORDER — VALSARTAN 160 MG PO TABS
160.0000 mg | ORAL_TABLET | Freq: Every day | ORAL | 0 refills | Status: DC
  Filled 2023-11-30: qty 90, 90d supply, fill #0

## 2023-12-08 ENCOUNTER — Other Ambulatory Visit (HOSPITAL_BASED_OUTPATIENT_CLINIC_OR_DEPARTMENT_OTHER): Payer: Self-pay

## 2023-12-08 MED ORDER — FLUOXETINE HCL 20 MG PO CAPS
20.0000 mg | ORAL_CAPSULE | Freq: Every day | ORAL | 2 refills | Status: AC
  Filled 2023-12-08: qty 30, 30d supply, fill #0
  Filled 2024-02-07: qty 30, 30d supply, fill #1
  Filled 2024-03-28: qty 30, 30d supply, fill #2

## 2023-12-08 MED ORDER — BUPROPION HCL ER (XL) 150 MG PO TB24
150.0000 mg | ORAL_TABLET | Freq: Every day | ORAL | 0 refills | Status: AC
  Filled 2023-12-15: qty 30, 30d supply, fill #0

## 2023-12-15 ENCOUNTER — Other Ambulatory Visit: Payer: Self-pay

## 2023-12-15 ENCOUNTER — Other Ambulatory Visit (HOSPITAL_BASED_OUTPATIENT_CLINIC_OR_DEPARTMENT_OTHER): Payer: Self-pay

## 2023-12-20 ENCOUNTER — Other Ambulatory Visit (HOSPITAL_BASED_OUTPATIENT_CLINIC_OR_DEPARTMENT_OTHER): Payer: Self-pay

## 2023-12-26 ENCOUNTER — Other Ambulatory Visit (HOSPITAL_BASED_OUTPATIENT_CLINIC_OR_DEPARTMENT_OTHER): Payer: Self-pay

## 2023-12-27 ENCOUNTER — Other Ambulatory Visit (HOSPITAL_BASED_OUTPATIENT_CLINIC_OR_DEPARTMENT_OTHER): Payer: Self-pay

## 2023-12-27 MED ORDER — FLUOXETINE HCL 20 MG PO CAPS: 20.0000 mg | ORAL_CAPSULE | Freq: Every day | ORAL | 1 refills | Status: AC

## 2023-12-27 MED ORDER — AMPHETAMINE-DEXTROAMPHETAMINE 10 MG PO TABS
10.0000 mg | ORAL_TABLET | Freq: Two times a day (BID) | ORAL | 0 refills | Status: AC
  Filled 2024-06-14: qty 60, 30d supply, fill #0

## 2023-12-27 MED ORDER — AMPHETAMINE-DEXTROAMPHETAMINE 10 MG PO TABS
10.0000 mg | ORAL_TABLET | Freq: Two times a day (BID) | ORAL | 0 refills | Status: AC
  Filled 2024-05-06: qty 60, 30d supply, fill #0

## 2023-12-27 MED ORDER — AMPHETAMINE-DEXTROAMPHETAMINE 10 MG PO TABS
10.0000 mg | ORAL_TABLET | Freq: Two times a day (BID) | ORAL | 0 refills | Status: AC
  Filled 2024-02-07: qty 60, 30d supply, fill #0

## 2024-02-07 ENCOUNTER — Other Ambulatory Visit (HOSPITAL_BASED_OUTPATIENT_CLINIC_OR_DEPARTMENT_OTHER): Payer: Self-pay

## 2024-02-12 ENCOUNTER — Other Ambulatory Visit (HOSPITAL_BASED_OUTPATIENT_CLINIC_OR_DEPARTMENT_OTHER): Payer: Self-pay

## 2024-02-12 MED ORDER — VALSARTAN 160 MG PO TABS
160.0000 mg | ORAL_TABLET | Freq: Every day | ORAL | 1 refills | Status: AC
  Filled 2024-02-12 – 2024-05-06 (×2): qty 90, 90d supply, fill #0

## 2024-02-12 MED ORDER — CLINDAMYCIN PHOS (TWICE-DAILY) 1 % EX GEL
CUTANEOUS | 3 refills | Status: AC
  Filled 2024-02-12: qty 30, 30d supply, fill #0

## 2024-02-22 ENCOUNTER — Other Ambulatory Visit (HOSPITAL_BASED_OUTPATIENT_CLINIC_OR_DEPARTMENT_OTHER): Payer: Self-pay

## 2024-03-29 ENCOUNTER — Other Ambulatory Visit (HOSPITAL_BASED_OUTPATIENT_CLINIC_OR_DEPARTMENT_OTHER): Payer: Self-pay

## 2024-03-29 MED ORDER — GOLYTELY 236 G PO SOLR
ORAL | 0 refills | Status: AC
  Filled 2024-03-29: qty 4000, 1d supply, fill #0

## 2024-03-29 MED ORDER — AMPHETAMINE-DEXTROAMPHETAMINE 10 MG PO TABS: 10.0000 mg | ORAL_TABLET | Freq: Two times a day (BID) | ORAL | 0 refills | Status: AC

## 2024-03-29 MED ORDER — FLUOXETINE HCL 20 MG PO CAPS
20.0000 mg | ORAL_CAPSULE | Freq: Every day | ORAL | 1 refills | Status: AC
  Filled 2024-03-29 – 2024-06-14 (×2): qty 90, 90d supply, fill #0

## 2024-05-06 ENCOUNTER — Other Ambulatory Visit: Payer: Self-pay

## 2024-05-06 ENCOUNTER — Other Ambulatory Visit (HOSPITAL_BASED_OUTPATIENT_CLINIC_OR_DEPARTMENT_OTHER): Payer: Self-pay

## 2024-06-14 ENCOUNTER — Other Ambulatory Visit (HOSPITAL_BASED_OUTPATIENT_CLINIC_OR_DEPARTMENT_OTHER): Payer: Self-pay

## 2024-06-17 ENCOUNTER — Other Ambulatory Visit (HOSPITAL_BASED_OUTPATIENT_CLINIC_OR_DEPARTMENT_OTHER): Payer: Self-pay

## 2024-06-17 MED ORDER — AMPHETAMINE-DEXTROAMPHETAMINE 10 MG PO TABS: 10.0000 mg | ORAL_TABLET | Freq: Two times a day (BID) | ORAL | 0 refills | Status: AC

## 2024-06-17 MED ORDER — FLUOXETINE HCL 10 MG PO CAPS
10.0000 mg | ORAL_CAPSULE | ORAL | 1 refills | Status: AC
  Filled 2024-06-17: qty 60, 60d supply, fill #0

## 2024-06-17 MED ORDER — FLUOXETINE HCL 20 MG PO CAPS
20.0000 mg | ORAL_CAPSULE | Freq: Every day | ORAL | 1 refills | Status: AC
  Filled 2024-06-17: qty 90, 90d supply, fill #0

## 2024-06-28 ENCOUNTER — Other Ambulatory Visit (HOSPITAL_BASED_OUTPATIENT_CLINIC_OR_DEPARTMENT_OTHER): Payer: Self-pay

## 2024-08-24 ENCOUNTER — Other Ambulatory Visit (HOSPITAL_BASED_OUTPATIENT_CLINIC_OR_DEPARTMENT_OTHER): Payer: Self-pay
